# Patient Record
Sex: Male | Born: 1985
Health system: Southern US, Community
[De-identification: ages and names within clinical notes are randomized; demographics above are authoritative.]

---
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
## 2003-04-28 ENCOUNTER — Emergency Department (HOSPITAL_COMMUNITY): Admission: EM | Admit: 2003-04-28 | Discharge: 2003-04-28 | Payer: Self-pay | Admitting: Emergency Medicine

## 2003-06-03 ENCOUNTER — Emergency Department (HOSPITAL_COMMUNITY): Admission: AC | Admit: 2003-06-03 | Discharge: 2003-06-03 | Payer: Self-pay

## 2004-08-10 ENCOUNTER — Emergency Department (HOSPITAL_COMMUNITY): Admission: EM | Admit: 2004-08-10 | Discharge: 2004-08-10 | Payer: Self-pay | Admitting: Emergency Medicine

## 2004-09-02 ENCOUNTER — Emergency Department (HOSPITAL_COMMUNITY): Admission: EM | Admit: 2004-09-02 | Discharge: 2004-09-02 | Payer: Self-pay | Admitting: Emergency Medicine

## 2006-06-09 ENCOUNTER — Emergency Department (HOSPITAL_COMMUNITY): Admission: EM | Admit: 2006-06-09 | Discharge: 2006-06-10 | Payer: Self-pay | Admitting: Emergency Medicine

## 2010-06-18 NOTE — Op Note (Signed)
[place]. [facility]  Patient:    RYDAN, GULYAS                         MRN: [id number] Proc. Date: 05/16/99 Attending:  Elana Alm. Thurston Hole, M.D.                           Operative Report  PREOPERATIVE DIAGNOSIS:  Left ankle Salter III distal tibia fracture.  POSTOPERATIVE DIAGNOSIS:  Left ankle Salter III distal tibia fracture.  OPERATION:  Open reduction, internal fixation of left ankle distal tibial fracture.  SURGEON:  Elana Alm. Thurston Hole, M.D.  ASSISTANT:  Kirstin Adelberger, P.A.  ANESTHESIA:  General.  OPERATIVE TIME:  One hour and 40 minutes.  COMPLICATIONS:  None.  INDICATIONS FOR PROCEDURE:  Neely is a 25 year old who sustained a left ankle displaced Salter III fracture last night while playing basketball.  Because of his significant displacement, he is now to undergo ORIF of this.  DESCRIPTION OF PROCEDURE:  Diago was brought to the operating room on May 16, 1999, placed on the operative table in the supine position.  After an adequate level of general anesthesia was obtained, his left foot and leg using sterile Betadine and draped using sterile technique.  The leg was exsanguinated and calf _ elevated at 300 mm.  Initially, through a 5 cm anterior incision based over the  fracture fragment, initial exposure was made.  The underlying subcutaneous tissues were incised along with skin incision.  The neurovascular bundle was carefully protected while the underlying joint capsule was exposed and incised revealing he displaced fracture fragment.  This involved the anterior one-third of the tibia  including the growth plate into the joint.  There was no significant articular cartilage damage except for the obvious fracture line.  The hematoma was removed from around the fracture site.  The fracture was held in a reduced position while two separate K-wires from the cannulated 4.0 mm screw set were placed from anterior to posterior  allow holding the fracture in an anatomically reduced position along with the articular surface.  Each of these were measured.  A 46 and 44 mm length was found to be the appropriate size.  They were overdrilled with a 2.7 mm drill and then cannulated.  The 4.0 mm screws were placed over each one of these guidewires, screwed down into position, holding the fracture in an anatomic and  rigidly fixed position.  The mortis remained anatomic as well.  After this was done, the ankle could be brought to a full range of motion with no movement at he fracture site.  At this point, the wound was irrigated.  The joint capsule closed with 2-0 Vicryl.  The subcutaneous tissues closed with 2-0 Vicryl.  The skin closed with staples.  Doppler evaluation for pulses showed good strong Doppler pulses n the anterior tib, dorsalis pedis and posterior tib.  Sterile dressings were applied.  The leg was placed in a short leg splint in neutral position.  The patient was then awakened and taken to the recovery room in stable condition.  Needle and sponge count was correct x 2 at the end of the case. DD:  05/16/99 TD:  05/17/99 Job: [id number]

## 2017-12-22 DIAGNOSIS — J410 Simple chronic bronchitis: Secondary | ICD-10-CM | POA: Diagnosis not present

## 2017-12-22 DIAGNOSIS — R05 Cough: Secondary | ICD-10-CM | POA: Diagnosis not present

## 2017-12-22 DIAGNOSIS — R0683 Snoring: Secondary | ICD-10-CM | POA: Diagnosis not present

## 2017-12-22 DIAGNOSIS — J4540 Moderate persistent asthma, uncomplicated: Secondary | ICD-10-CM | POA: Diagnosis not present

## 2017-12-25 DIAGNOSIS — R05 Cough: Secondary | ICD-10-CM | POA: Diagnosis not present

## 2018-02-02 DIAGNOSIS — I739 Peripheral vascular disease, unspecified: Secondary | ICD-10-CM | POA: Diagnosis not present

## 2018-02-02 DIAGNOSIS — R05 Cough: Secondary | ICD-10-CM | POA: Diagnosis not present

## 2018-02-02 DIAGNOSIS — R0600 Dyspnea, unspecified: Secondary | ICD-10-CM | POA: Diagnosis not present

## 2018-02-02 DIAGNOSIS — J209 Acute bronchitis, unspecified: Secondary | ICD-10-CM | POA: Diagnosis not present

## 2018-02-02 DIAGNOSIS — R0602 Shortness of breath: Secondary | ICD-10-CM | POA: Diagnosis not present

## 2018-02-02 DIAGNOSIS — Z87891 Personal history of nicotine dependence: Secondary | ICD-10-CM | POA: Diagnosis not present

## 2018-02-02 DIAGNOSIS — I10 Essential (primary) hypertension: Secondary | ICD-10-CM | POA: Diagnosis not present

## 2018-02-05 DIAGNOSIS — R05 Cough: Secondary | ICD-10-CM | POA: Insufficient documentation

## 2018-02-05 DIAGNOSIS — R1084 Generalized abdominal pain: Secondary | ICD-10-CM | POA: Diagnosis not present

## 2018-02-05 DIAGNOSIS — R52 Pain, unspecified: Secondary | ICD-10-CM | POA: Diagnosis not present

## 2018-02-05 DIAGNOSIS — R079 Chest pain, unspecified: Secondary | ICD-10-CM | POA: Diagnosis not present

## 2018-02-05 DIAGNOSIS — R0902 Hypoxemia: Secondary | ICD-10-CM | POA: Diagnosis not present

## 2018-02-05 DIAGNOSIS — R072 Precordial pain: Secondary | ICD-10-CM | POA: Insufficient documentation

## 2018-02-05 NOTE — ED Triage Notes (Signed)
Pt presents with LUQ abd pain and already diagnosed bronchitis which he is on steroids and prescriptions for.

## 2018-02-06 ENCOUNTER — Emergency Department (HOSPITAL_COMMUNITY)
Admission: EM | Admit: 2018-02-06 | Discharge: 2018-02-06 | Disposition: A | Discharge status: Home / Self Care | Payer: BLUE CROSS/BLUE SHIELD | Attending: Emergency Medicine | Admitting: Emergency Medicine

## 2018-02-06 ENCOUNTER — Encounter (HOSPITAL_COMMUNITY): Payer: Self-pay

## 2018-02-06 ENCOUNTER — Emergency Department (HOSPITAL_COMMUNITY): Payer: BLUE CROSS/BLUE SHIELD

## 2018-02-06 DIAGNOSIS — R05 Cough: Secondary | ICD-10-CM

## 2018-02-06 DIAGNOSIS — R072 Precordial pain: Secondary | ICD-10-CM

## 2018-02-06 DIAGNOSIS — R079 Chest pain, unspecified: Secondary | ICD-10-CM | POA: Diagnosis not present

## 2018-02-06 DIAGNOSIS — R059 Cough, unspecified: Secondary | ICD-10-CM

## 2018-02-06 MED ORDER — HYDROCODONE-ACETAMINOPHEN 5-325 MG PO TABS: 1.0000 | ORAL_TABLET | Freq: Four times a day (QID) | ORAL | 0 refills | Status: DC | PRN

## 2018-02-06 MED ORDER — ALBUTEROL SULFATE (2.5 MG/3ML) 0.083% IN NEBU
5.0000 mg | INHALATION_SOLUTION | Freq: Once | RESPIRATORY_TRACT | Status: AC
  Administered 2018-02-06: 5 mg via RESPIRATORY_TRACT
  Filled 2018-02-06: qty 6

## 2018-02-06 MED ORDER — IBUPROFEN 200 MG PO TABS
400.0000 mg | ORAL_TABLET | Freq: Once | ORAL | Status: AC
  Administered 2018-02-06: 400 mg via ORAL
  Filled 2018-02-06: qty 2

## 2018-02-06 NOTE — ED Notes (Signed)
IS education given  Pt verbalized understand  Pt demonstrated correct use of IS  Pt breathing tx given

## 2018-02-06 NOTE — ED Triage Notes (Signed)
Pt complains of a productive cough for two days and now he states that his ribs on the left side are hurting Pt states that he's been diagnosed with bronchitis

## 2018-02-06 NOTE — ED Provider Notes (Signed)
[facility]-EMERGENCY DEPT Provider Note   CSN: [id number] Arrival date & time: 02/05/18  2230     History   Chief Complaint Chief Complaint  Patient presents with  . Cough    HPI James Levine is a 33 y.o. male.  The history is provided by the patient.  Cough  Severity:  Moderate Onset quality:  Gradual Timing:  Intermittent Progression:  Improving Worsened by:  Nothing Associated symptoms: no fever    Patient presents for evaluation for cough and chest wall pain.  He reports he has been coughing for up to a month.  He was seen in an outside hospital last week, diagnosed with bronchitis and given azithromycin/prednisone/codeine.  He reports this is started to improve his cough.  However he has been having persistent left-sided chest wall pain with palpation and coughing.  No fevers or vomiting.  No hemoptysis. Quit smoking about 4 weeks ago PMH-none Soc hx - former smoker Home Medications    Prior to Admission medications   Medication Sig Start Date End Date Taking? Authorizing Provider  guaiFENesin-codeine (ROBITUSSIN AC) 100-10 MG/5ML syrup Take 5 mLs by mouth every 6 (six) hours as needed for cough.   Yes [provider]  predniSONE (DELTASONE) 50 MG tablet Take 50 mg by mouth daily with breakfast.   Yes [provider]  HYDROcodone-acetaminophen (NORCO/VICODIN) 5-325 MG tablet Take 1 tablet by mouth every 6 (six) hours as needed for severe pain. 02/06/18   Zadie Rhine, MD    Family History History reviewed. No pertinent family history.  Social History Social History   Tobacco Use  . Smoking status: Never Smoker  . Smokeless tobacco: Never Used  Substance Use Topics  . Alcohol use: Yes  . Drug use: Never     Allergies   Patient has no known allergies.   Review of Systems Review of Systems  Constitutional: Negative for fever.  Respiratory: Positive for cough.   All other systems reviewed and are  negative.    Physical Exam Updated Vital Signs BP (!) 146/99 (BP Location: Right Arm)   Pulse 85   Temp 98.5 F (36.9 C) (Oral)   Resp 16   Ht 1.753 m (5\' 9" )   Wt (!) 164.7 kg   SpO2 93%   BMI 53.64 kg/m   Physical Exam CONSTITUTIONAL: Well developed/well nourished HEAD: Normocephalic/atraumatic EYES: EOMI ENMT: Mucous membranes moist NECK: supple no meningeal signs SPINE/BACK:entire spine nontender CV: S1/S2 noted, no murmurs/rubs/gallops noted LUNGS: Lungs are clear to auscultation bilaterally, no apparent distress Chest-point tenderness to left ribs without crepitus or bruising ABDOMEN: soft, nontender, obese  NEURO: Pt is awake/alert/appropriate, moves all extremitiesx4.  No facial droop.   EXTREMITIES: pulses normal/equal, full ROM,no lower extremity edema SKIN: warm, color normal PSYCH: no abnormalities of mood noted, alert and oriented to situation   ED Treatments / Results  Labs (all labs ordered are listed, but only abnormal results are displayed) Labs Reviewed - No data to display  EKG None  Radiology Dg Chest 2 View  Result Date: 02/06/2018 CLINICAL DATA:  Cough and congestion with left anterior chest pain for 2 weeks. EXAM: CHEST - 2 VIEW COMPARISON:  12/25/2017 FINDINGS: Borderline cardiomegaly, stable in appearance. Nonaneurysmal thoracic aorta. No acute pulmonary consolidation, effusion or edema. No acute osseous abnormality. IMPRESSION: No active cardiopulmonary disease. Electronically Signed   By: Tollie Eth M.D.   On: 02/06/2018 01:57    Procedures Procedures (including critical care time)  Medications Ordered in ED  Medications  albuterol (PROVENTIL) (2.5 MG/3ML) 0.083% nebulizer solution 5 mg (5 mg Nebulization Given 02/06/18 0600)  ibuprofen (ADVIL,MOTRIN) tablet 400 mg (400 mg Oral Given 02/06/18 [id number])     Initial Impression / Assessment and Plan / ED Course  I have reviewed the triage vital signs and the nursing notes.  Pertinent    imaging results that were available during my care of the patient were reviewed by me and considered in my medical decision making (see chart for details).     Patient's main concern was his chest wall pain.  He admits that the cough has been improving since starting medicines last week.  However he is having persistent chest wall pain.  I reviewed the x-ray and there is no obvious rib fracture, but he has obvious exquisite tenderness likely due to persistent coughing for a month.  I offered him an incentive spirometer anti-inflammatory and pain medicines.  Patient agreeable with plan.  He will continue to not smoke.  We discussed return precautions  Final Clinical Impressions(s) / ED Diagnoses   Final diagnoses:  Cough  Precordial pain    ED Discharge Orders         Ordered    HYDROcodone-acetaminophen (NORCO/VICODIN) 5-325 MG tablet  Every 6 hours PRN     02/06/18 0554           Zadie RhineWickline, Arcenia Scarbro, MD 02/06/18 [id number]

## 2018-03-12 DIAGNOSIS — Z0000 Encounter for general adult medical examination without abnormal findings: Secondary | ICD-10-CM | POA: Diagnosis not present

## 2018-03-12 DIAGNOSIS — Z13220 Encounter for screening for lipoid disorders: Secondary | ICD-10-CM | POA: Diagnosis not present

## 2018-03-14 DIAGNOSIS — R0681 Apnea, not elsewhere classified: Secondary | ICD-10-CM | POA: Diagnosis not present

## 2018-03-14 DIAGNOSIS — Z6841 Body Mass Index (BMI) 40.0 and over, adult: Secondary | ICD-10-CM | POA: Diagnosis not present

## 2018-04-07 DIAGNOSIS — R52 Pain, unspecified: Secondary | ICD-10-CM | POA: Diagnosis not present

## 2018-04-07 DIAGNOSIS — J111 Influenza due to unidentified influenza virus with other respiratory manifestations: Secondary | ICD-10-CM | POA: Diagnosis not present

## 2018-04-07 DIAGNOSIS — J101 Influenza due to other identified influenza virus with other respiratory manifestations: Secondary | ICD-10-CM | POA: Diagnosis not present

## 2018-05-07 ENCOUNTER — Observation Stay (HOSPITAL_COMMUNITY)
Admission: EM | Admit: 2018-05-07 | Discharge: 2018-05-09 | Disposition: A | Discharge status: Home / Self Care | Payer: BLUE CROSS/BLUE SHIELD | Attending: Internal Medicine | Admitting: Internal Medicine

## 2018-05-07 ENCOUNTER — Emergency Department (HOSPITAL_COMMUNITY): Payer: BLUE CROSS/BLUE SHIELD

## 2018-05-07 ENCOUNTER — Encounter (HOSPITAL_COMMUNITY): Payer: Self-pay | Admitting: Emergency Medicine

## 2018-05-07 ENCOUNTER — Other Ambulatory Visit: Payer: Self-pay

## 2018-05-07 ENCOUNTER — Ambulatory Visit (HOSPITAL_COMMUNITY)
Admission: EM | Admit: 2018-05-07 | Discharge: 2018-05-07 | Disposition: A | Discharge status: Home / Self Care | Payer: BLUE CROSS/BLUE SHIELD | Source: Home / Self Care | Attending: Family Medicine | Admitting: Family Medicine

## 2018-05-07 DIAGNOSIS — G4733 Obstructive sleep apnea (adult) (pediatric): Secondary | ICD-10-CM | POA: Diagnosis not present

## 2018-05-07 DIAGNOSIS — J9601 Acute respiratory failure with hypoxia: Secondary | ICD-10-CM | POA: Diagnosis not present

## 2018-05-07 DIAGNOSIS — S2242XA Multiple fractures of ribs, left side, initial encounter for closed fracture: Secondary | ICD-10-CM | POA: Diagnosis not present

## 2018-05-07 DIAGNOSIS — J90 Pleural effusion, not elsewhere classified: Secondary | ICD-10-CM | POA: Diagnosis not present

## 2018-05-07 DIAGNOSIS — R05 Cough: Secondary | ICD-10-CM | POA: Diagnosis not present

## 2018-05-07 DIAGNOSIS — Z79899 Other long term (current) drug therapy: Secondary | ICD-10-CM | POA: Insufficient documentation

## 2018-05-07 DIAGNOSIS — R0602 Shortness of breath: Secondary | ICD-10-CM | POA: Diagnosis not present

## 2018-05-07 DIAGNOSIS — G8929 Other chronic pain: Secondary | ICD-10-CM | POA: Insufficient documentation

## 2018-05-07 DIAGNOSIS — R079 Chest pain, unspecified: Secondary | ICD-10-CM | POA: Diagnosis not present

## 2018-05-07 DIAGNOSIS — X58XXXA Exposure to other specified factors, initial encounter: Secondary | ICD-10-CM | POA: Diagnosis not present

## 2018-05-07 DIAGNOSIS — I517 Cardiomegaly: Secondary | ICD-10-CM | POA: Diagnosis not present

## 2018-05-07 DIAGNOSIS — J9611 Chronic respiratory failure with hypoxia: Secondary | ICD-10-CM | POA: Diagnosis present

## 2018-05-07 DIAGNOSIS — S2232XA Fracture of one rib, left side, initial encounter for closed fracture: Secondary | ICD-10-CM | POA: Diagnosis present

## 2018-05-07 DIAGNOSIS — R109 Unspecified abdominal pain: Secondary | ICD-10-CM | POA: Diagnosis not present

## 2018-05-07 DIAGNOSIS — R0902 Hypoxemia: Secondary | ICD-10-CM

## 2018-05-07 DIAGNOSIS — Z7952 Long term (current) use of systemic steroids: Secondary | ICD-10-CM | POA: Insufficient documentation

## 2018-05-07 DIAGNOSIS — Z6841 Body Mass Index (BMI) 40.0 and over, adult: Secondary | ICD-10-CM | POA: Insufficient documentation

## 2018-05-07 HISTORY — DX: Morbid (severe) obesity due to excess calories: E66.01

## 2018-05-07 LAB — COMPREHENSIVE METABOLIC PANEL
ALT: 24 U/L (ref 0–44)
AST: 23 U/L (ref 15–41)
Albumin: 3.9 g/dL (ref 3.5–5.0)
Alkaline Phosphatase: 81 U/L (ref 38–126)
Anion gap: 6 (ref 5–15)
BUN: 10 mg/dL (ref 6–20)
CO2: 35 mmol/L — ABNORMAL HIGH (ref 22–32)
Calcium: 9.1 mg/dL (ref 8.9–10.3)
Chloride: 98 mmol/L (ref 98–111)
Creatinine, Ser: 1.03 mg/dL (ref 0.61–1.24)
GFR calc Af Amer: >60 mL/min (ref >60)
GFR calc non Af Amer: >60 mL/min (ref >60)
Glucose, Bld: 112 mg/dL — ABNORMAL HIGH (ref 70–99)
Potassium: 4.2 mmol/L (ref 3.5–5.1)
Sodium: 139 mmol/L (ref 135–145)
Total Bilirubin: 0.7 mg/dL (ref 0.3–1.2)
Total Protein: 7.4 g/dL (ref 6.5–8.1)

## 2018-05-07 LAB — CBC WITH DIFFERENTIAL/PLATELET
Abs Immature Granulocytes: 0.05 10*3/uL (ref 0.00–0.07)
Basophils Absolute: 0 10*3/uL (ref 0.0–0.1)
Basophils Relative: 1 %
Eosinophils Absolute: 0.1 10*3/uL (ref 0.0–0.5)
Eosinophils Relative: 1 %
HCT: 47.2 % (ref 39.0–52.0)
Hemoglobin: 13.4 g/dL (ref 13.0–17.0)
Immature Granulocytes: 1 %
Lymphocytes Relative: 20 %
Lymphs Abs: 1.6 10*3/uL (ref 0.7–4.0)
MCH: 26.2 pg (ref 26.0–34.0)
MCHC: 28.4 g/dL — ABNORMAL LOW (ref 30.0–36.0)
MCV: 92.4 fL (ref 80.0–100.0)
Monocytes Absolute: 0.6 10*3/uL (ref 0.1–1.0)
Monocytes Relative: 7 %
Neutro Abs: 5.8 10*3/uL (ref 1.7–7.7)
Neutrophils Relative %: 70 %
Platelets: 214 10*3/uL (ref 150–400)
RBC: 5.11 MIL/uL (ref 4.22–5.81)
RDW: 13.1 % (ref 11.5–15.5)
WBC: 8.1 10*3/uL (ref 4.0–10.5)
nRBC: 0 % (ref 0.0–0.2)

## 2018-05-07 LAB — TROPONIN I
Troponin I: <0.03 ng/mL (ref <0.03)
Troponin I: <0.03 ng/mL (ref <0.03)

## 2018-05-07 LAB — BRAIN NATRIURETIC PEPTIDE: B Natriuretic Peptide: 56.1 pg/mL (ref 0.0–100.0)

## 2018-05-07 LAB — LIPASE, BLOOD: Lipase: 26 U/L (ref 11–51)

## 2018-05-07 MED ORDER — OXYCODONE-ACETAMINOPHEN 5-325 MG PO TABS
1.0000 | ORAL_TABLET | ORAL | Status: DC | PRN
  Administered 2018-05-08 (×2): 1 via ORAL
  Filled 2018-05-07 (×2): qty 1

## 2018-05-07 MED ORDER — IOHEXOL 350 MG/ML SOLN
80.0000 mL | Freq: Once | INTRAVENOUS | Status: AC | PRN
  Administered 2018-05-07: 80 mL via INTRAVENOUS

## 2018-05-07 MED ORDER — MORPHINE SULFATE (PF) 2 MG/ML IV SOLN
1.0000 mg | INTRAVENOUS | Status: DC | PRN
  Administered 2018-05-08: 1 mg via INTRAVENOUS
  Filled 2018-05-07: qty 1

## 2018-05-07 NOTE — ED Notes (Signed)
Patient transported with RN to ED via wheelchair for further evaluation of decreased oxygen saturation per PA recommendations. Patient verbalizes understanding.
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
## 2018-05-07 NOTE — ED Triage Notes (Signed)
Pt reports left sided flank pain x months worse today; pt sts painful upon inspiration; pt noted to have O2 sats of 87-90% on RA without hx of same; providers alerted and pt placed on 2L [place]
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
## 2018-05-07 NOTE — Discharge Instructions (Signed)
Given low oxygen level at 87-90% on room air, go to the emergency department for further evaluation needed.

## 2018-05-07 NOTE — ED Notes (Signed)
Patient transported to CT 

## 2018-05-07 NOTE — ED Provider Notes (Signed)
[facility]    CSN: [id number] Arrival date & time: 05/07/18  1749     History   Chief Complaint Chief Complaint  Patient presents with  . Flank Pain    HPI James Levine is a 33 y.o. male.   33 year old male comes in for left sided flank/rib pain for the past few months.  States symptoms first started back in October 2019, where he had cough, shortness of breath, fever, and was diagnosed with bronchitis.  He was given Z-Pak and inhaler.  States shortness of breath and fever improved, but cough remained.  He then had another episode of similar symptoms in December 2019, also with fever,and was given Z-Pak, codeine cough syrup with some improvement of symptoms.  He was evaluated at the emergency department January 2020, with similar symptoms, as well as left-sided chest wall/flank pain.  Has had multiple chest x-rays, and states it has been negative.  He had a 2-hour car ride in January, otherwise no immobilization.  He denies leg swelling, hormone use. Former smoker.  He does state that he has some orthopnea since November, now using 2 pillows to sleep at night.  He has continued to cough, without worsening.  Denies shortness of breath without laying down.  Denies fever, chills, night sweats.  States he came in today because the pain is worse.  He denies personal or family history of blood clot.  Denies personal history of heart disease or lung disease.  Family history of pulmonary hypertension, CHF.     History reviewed. No pertinent past medical history.  There are no active problems to display for this patient.   History reviewed. No pertinent surgical history.     Home Medications    Prior to Admission medications   Medication Sig Start Date End Date Taking? Authorizing Provider  guaiFENesin-codeine (ROBITUSSIN AC) 100-10 MG/5ML syrup Take 5 mLs by mouth every 6 (six) hours as needed for cough.    [provider]  HYDROcodone-acetaminophen (NORCO/VICODIN)  5-325 MG tablet Take 1 tablet by mouth every 6 (six) hours as needed for severe pain. Patient not taking: Reported on 05/07/2018 02/06/18   Zadie RhineWickline, Donald, MD  predniSONE (DELTASONE) 50 MG tablet Take 50 mg by mouth daily with breakfast.    [provider]    Family History History reviewed. No pertinent family history.  Social History Social History   Tobacco Use  . Smoking status: Never Smoker  . Smokeless tobacco: Never Used  Substance Use Topics  . Alcohol use: Yes  . Drug use: Never     Allergies   Patient has no known allergies.   Review of Systems Review of Systems  Reason unable to perform ROS: See HPI as above.     Physical Exam Triage Vital Signs ED Triage Vitals  Enc Vitals Group     BP      Pulse      Resp      Temp      Temp src      SpO2      Weight      Height      Head Circumference      Peak Flow      Pain Score      Pain Loc      Pain Edu?      Excl. in GC?    No data found.  Updated Vital Signs BP (!) 167/102 (BP Location: Right Arm)   Pulse 99   Temp 98.3  F (36.8 C) (Oral)   Resp 18   SpO2 90%   Physical Exam Constitutional:      General: He is not in acute distress.    Appearance: He is well-developed. He is not ill-appearing, toxic-appearing or diaphoretic.  HENT:     Head: Normocephalic and atraumatic.  Eyes:     Conjunctiva/sclera: Conjunctivae normal.     Pupils: Pupils are equal, round, and reactive to light.  Neck:     Musculoskeletal: Normal range of motion and neck supple.  Cardiovascular:     Rate and Rhythm: Normal rate and regular rhythm.     Heart sounds: No murmur. No friction rub. No gallop.   Pulmonary:     Comments: Patient speaking in full sentences without difficulty. No respiratory distress. Difficult lung exam due to body habitus. Lungs clear to auscultation without obvious adventitious lung sounds.  Chest:     Comments: Tenderness to palpation of low lateral left rib.  Neurological:      Mental Status: He is alert and oriented to person, place, and time.    UC Treatments / Results  Labs (all labs ordered are listed, but only abnormal results are displayed) Labs Reviewed - No data to display  EKG None  Radiology No results found.  Procedures Procedures (including critical care time)  Medications Ordered in UC Medications - No data to display  Initial Impression / Assessment and Plan / UC Course  I have reviewed the triage vital signs and the nursing notes.  Pertinent labs & imaging results that were available during my care of the patient were reviewed by me and considered in my medical decision making (see chart for details).    33 year old male who presents with 22-month history of cough, few month history of left-sided chest wall/rib pain.  He has had multiple episodes of URI symptoms, some with fever, and has been given 2 rounds of antibiotics.  Cough has persisted, with intermittent shortness of breath.  73-month history of orthopnea.  He presents with new hypoxia, 87-90%.  He has been able to speak in full sentences without difficulty.  Lungs clear to auscultation bilaterally without obvious adventitious lung sounds, though difficult exam due to body habitus.  Given new onset hypoxia, ?PE/CHF, will discharge to emergency department for further evaluation needed.  Final Clinical Impressions(s) / UC Diagnoses   Final diagnoses:  Hypoxia   ED Prescriptions    None        Belinda Fisher, PA-C 05/07/18 1846

## 2018-05-07 NOTE — ED Triage Notes (Signed)
Pt here from UC with c/o left sided flank pain and sob. Pt states UC was concerned for a blood clot. Pt SP02 on arrival 86% on RA.  Pt placed on 2L [place].  No acute distress.

## 2018-05-08 ENCOUNTER — Encounter (HOSPITAL_COMMUNITY): Payer: Self-pay | Admitting: Internal Medicine

## 2018-05-08 DIAGNOSIS — S2242XA Multiple fractures of ribs, left side, initial encounter for closed fracture: Secondary | ICD-10-CM | POA: Diagnosis not present

## 2018-05-08 DIAGNOSIS — G4733 Obstructive sleep apnea (adult) (pediatric): Secondary | ICD-10-CM | POA: Diagnosis not present

## 2018-05-08 DIAGNOSIS — J9601 Acute respiratory failure with hypoxia: Secondary | ICD-10-CM | POA: Diagnosis not present

## 2018-05-08 DIAGNOSIS — I517 Cardiomegaly: Secondary | ICD-10-CM

## 2018-05-08 LAB — CBC
HCT: 48.2 % (ref 39.0–52.0)
Hemoglobin: 13.9 g/dL (ref 13.0–17.0)
MCH: 27.1 pg (ref 26.0–34.0)
MCHC: 28.8 g/dL — ABNORMAL LOW (ref 30.0–36.0)
MCV: 94 fL (ref 80.0–100.0)
Platelets: 205 10*3/uL (ref 150–400)
RBC: 5.13 MIL/uL (ref 4.22–5.81)
RDW: 13.2 % (ref 11.5–15.5)
WBC: 8.3 10*3/uL (ref 4.0–10.5)
nRBC: 0 % (ref 0.0–0.2)

## 2018-05-08 LAB — BASIC METABOLIC PANEL
Anion gap: 6 (ref 5–15)
BUN: 11 mg/dL (ref 6–20)
CO2: 35 mmol/L — ABNORMAL HIGH (ref 22–32)
Calcium: 8.9 mg/dL (ref 8.9–10.3)
Chloride: 98 mmol/L (ref 98–111)
Creatinine, Ser: 1.09 mg/dL (ref 0.61–1.24)
GFR calc Af Amer: >60 mL/min (ref >60)
GFR calc non Af Amer: >60 mL/min (ref >60)
Glucose, Bld: 109 mg/dL — ABNORMAL HIGH (ref 70–99)
Potassium: 5.1 mmol/L (ref 3.5–5.1)
Sodium: 139 mmol/L (ref 135–145)

## 2018-05-08 LAB — HIV ANTIBODY (ROUTINE TESTING W REFLEX): HIV Screen 4th Generation wRfx: NONREACTIVE

## 2018-05-08 MED ORDER — DM-GUAIFENESIN ER 30-600 MG PO TB12: 1.0000 | ORAL_TABLET | Freq: Two times a day (BID) | ORAL | Status: DC | PRN

## 2018-05-08 MED ORDER — ACETAMINOPHEN 650 MG RE SUPP: 650.0000 mg | Freq: Four times a day (QID) | RECTAL | Status: DC | PRN

## 2018-05-08 MED ORDER — PANTOPRAZOLE SODIUM 40 MG PO TBEC
40.0000 mg | DELAYED_RELEASE_TABLET | Freq: Every day | ORAL | Status: DC
  Administered 2018-05-08 – 2018-05-09 (×2): 40 mg via ORAL
  Filled 2018-05-08 (×2): qty 1

## 2018-05-08 MED ORDER — ENOXAPARIN SODIUM 80 MG/0.8ML ~~LOC~~ SOLN
80.0000 mg | SUBCUTANEOUS | Status: DC
  Filled 2018-05-08: qty 0.8

## 2018-05-08 MED ORDER — KETOROLAC TROMETHAMINE 15 MG/ML IJ SOLN
7.5000 mg | Freq: Three times a day (TID) | INTRAMUSCULAR | Status: DC
  Administered 2018-05-08 – 2018-05-09 (×2): 7.5 mg via INTRAVENOUS
  Filled 2018-05-08 (×2): qty 1

## 2018-05-08 MED ORDER — ACETAMINOPHEN 325 MG PO TABS
650.0000 mg | ORAL_TABLET | Freq: Four times a day (QID) | ORAL | Status: DC | PRN
  Administered 2018-05-09: 650 mg via ORAL
  Filled 2018-05-08: qty 2

## 2018-05-08 MED ORDER — ONDANSETRON HCL 4 MG/2ML IJ SOLN: 4.0000 mg | Freq: Four times a day (QID) | INTRAMUSCULAR | Status: DC | PRN

## 2018-05-08 MED ORDER — ONDANSETRON HCL 4 MG PO TABS: 4.0000 mg | ORAL_TABLET | Freq: Four times a day (QID) | ORAL | Status: DC | PRN

## 2018-05-08 MED ORDER — ALBUTEROL SULFATE (2.5 MG/3ML) 0.083% IN NEBU: 3.0000 mL | INHALATION_SOLUTION | RESPIRATORY_TRACT | Status: DC | PRN

## 2018-05-08 NOTE — Plan of Care (Signed)
  Problem: Education: Goal: Knowledge of General Education information will improve Description: Including pain rating scale, medication(s)/side effects and non-pharmacologic comfort measures Outcome: Progressing   Problem: Clinical Measurements: Goal: Ability to maintain clinical measurements within normal limits will improve Outcome: Progressing   Problem: Coping: Goal: Level of anxiety will decrease Outcome: Progressing   Problem: Pain Managment: Goal: General experience of comfort will improve Outcome: Progressing   Problem: Safety: Goal: Ability to remain free from injury will improve Outcome: Progressing   Problem: Skin Integrity: Goal: Risk for impaired skin integrity will decrease Outcome: Progressing   
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
## 2018-05-08 NOTE — Progress Notes (Signed)
Pt ambulated in the hallway. Patient denied shortness of breath or pain while ambulating.  Oxygen saturation dropped to 84, put patient in 3 L NS oxygen saturation up to 94.   Patient is currently lying in bed on 2 Liters nasal canula with oxygen saturation on 96%.  Patient refused bed alarm on, education given, pt verbalizes understanding and will call if need assistance.

## 2018-05-08 NOTE — Progress Notes (Signed)
SATURATION QUALIFICATIONS: (This note is used to comply with regulatory documentation for home oxygen)  Patient Saturations on Room Air at Rest = 90%  Patient Saturations on Room Air while Ambulating = 84%  Patient Saturations on 3 Liters of oxygen while Ambulating = 94%

## 2018-05-08 NOTE — Progress Notes (Signed)
   05/08/18 1700  Incentive Spirometry  Incentive Spirometry Goal (mL) (RN or RT) 1000 mL  Incentive Spirometry - Achieved (mL) (RN, NT, or RT) 750 mL  Incentive Spirometry - # of Times (RN or NT) 5  Incentive Spirometry Effort (RN) Needs reinforcement

## 2018-05-08 NOTE — Progress Notes (Signed)
I responded to a Hall to provide spiritual support for the patient. I visited the patient's room and shared words of encouragement. I shared that the Chaplain is available to provide additional support as needed or requested.    05/08/18 1600  Clinical Encounter Type  Visited With Patient  Visit Type Spiritual support  Referral From Nurse  Consult/Referral To Chaplain  Spiritual Encounters  Spiritual Needs Prayer    Chaplain Dr Melvyn Novas

## 2018-05-08 NOTE — ED Notes (Addendum)
ED TO INPATIENT HANDOFF REPORT  ED Nurse Name and Phone #:  Florentina Addison [phone]  S Name/Age/Gender James Levine 33 y.o. male Room/Bed: 024C/024C  Code Status   Code Status: Full Code  Home/SNF/Other Home Patient oriented to: self, place, time and situation Is this baseline? Yes   Triage Complete: Triage complete  Chief Complaint Flank Pain   Triage Note Pt here from UC with c/o left sided flank pain and sob. Pt states UC was concerned for a blood clot. Pt SP02 on arrival 86% on RA.  Pt placed on 2L [place].  No acute distress.     Allergies No Known Allergies  Level of Care/Admitting Diagnosis ED Disposition    ED Disposition Condition Comment   Admit  Hospital Area: [facility] [[id number]]  Level of Care: Telemetry Medical [104]  I expect the patient will be discharged within 24 hours: No (not a candidate for 5C-Observation unit)  Diagnosis: Acute respiratory failure with hypoxia [facility]) [454098]  Admitting Physician: Lorretta Harp [[id number]]  Attending Physician: Lorretta Harp [[id number]]  PT Class (Do Not Modify): Observation [104]  PT Acc Code (Do Not Modify): Observation [10022]       B Medical/Surgery History Past Medical History:  Diagnosis Date  . Morbid obesity (HCC)    History reviewed. No pertinent surgical history.   A IV Location/Drains/Wounds Patient Lines/Drains/Airways Status   Active Line/Drains/Airways    Name:   Placement date:   Placement time:   Site:   Days:   Peripheral IV 05/07/18 Left Antecubital   05/07/18    1902    Antecubital   1          Intake/Output Last 24 hours No intake or output data in the 24 hours ending 05/08/18 0200  Labs/Imaging Results for orders placed or performed during the hospital encounter of 05/07/18 (from the past 48 hour(s))  CBC with Differential     Status: Abnormal   Collection Time: 05/07/18  7:30 PM  Result Value Ref Range   WBC 8.1 4.0 - 10.5 K/uL   RBC 5.11 4.22 - 5.81 MIL/uL   Hemoglobin 13.4 13.0 -  17.0 g/dL   HCT 11.9 14.7 - 82.9 %   MCV 92.4 80.0 - 100.0 fL   MCH 26.2 26.0 - 34.0 pg   MCHC 28.4 (L) 30.0 - 36.0 g/dL   RDW 56.2 13.0 - 86.5 %   Platelets 214 150 - 400 K/uL   nRBC 0.0 0.0 - 0.2 %   Neutrophils Relative % 70 %   Neutro Abs 5.8 1.7 - 7.7 K/uL   Lymphocytes Relative 20 %   Lymphs Abs 1.6 0.7 - 4.0 K/uL   Monocytes Relative 7 %   Monocytes Absolute 0.6 0.1 - 1.0 K/uL   Eosinophils Relative 1 %   Eosinophils Absolute 0.1 0.0 - 0.5 K/uL   Basophils Relative 1 %   Basophils Absolute 0.0 0.0 - 0.1 K/uL   Immature Granulocytes 1 %   Abs Immature Granulocytes 0.05 0.00 - 0.07 K/uL    Comment: Performed at [facility], [address], [place], [place] [address]  Comprehensive metabolic panel     Status: Abnormal   Collection Time: 05/07/18  7:30 PM  Result Value Ref Range   Sodium 139 135 - 145 mmol/L   Potassium 4.2 3.5 - 5.1 mmol/L   Chloride 98 98 - 111 mmol/L   CO2 35 (H) 22 - 32 mmol/L   Glucose, Bld 112 (H) 70 -  99 mg/dL   BUN 10 6 - 20 mg/dL   Creatinine, Ser 4.091.03 0.61 - 1.24 mg/dL   Calcium 9.1 8.9 - 81.110.3 mg/dL   Total Protein 7.4 6.5 - 8.1 g/dL   Albumin 3.9 3.5 - 5.0 g/dL   AST 23 15 - 41 U/L   ALT 24 0 - 44 U/L   Alkaline Phosphatase 81 38 - 126 U/L   Total Bilirubin 0.7 0.3 - 1.2 mg/dL   GFR calc non Af Amer >60 >60 mL/min   GFR calc Af Amer >60 >60 mL/min   Anion gap 6 5 - 15    Comment: Performed at [facility], [address] [place], [place] [address]  Brain natriuretic peptide     Status: None   Collection Time: 05/07/18  7:30 PM  Result Value Ref Range   B Natriuretic Peptide 56.1 0.0 - 100.0 pg/mL    Comment: Performed at [facility], [address] [place], [place] [address]  Lipase, blood     Status: None   Collection Time: 05/07/18  7:30 PM  Result Value Ref Range   Lipase 26 11 - 51 U/L    Comment: Performed at [facility], [address] [place], [place] [address]  Troponin I - Now Then Q3H      Status: None   Collection Time: 05/07/18  7:30 PM  Result Value Ref Range   Troponin I <0.03 <0.03 ng/mL    Comment: Performed at [facility], [address] [place], [place] [address]  Troponin I - Now Then Q3H     Status: None   Collection Time: 05/07/18 10:26 PM  Result Value Ref Range   Troponin I <0.03 <0.03 ng/mL    Comment: Performed at [facility], [address] [place], [place] [address]   Ct Angio Chest Pe W And/or Wo Contrast  Result Date: 05/07/2018 CLINICAL DATA:  PE suspected, high pretest prob. Shortness of breath. Decreased oxygen saturation. EXAM: CT ANGIOGRAPHY CHEST WITH CONTRAST TECHNIQUE: Multidetector CT imaging of the chest was performed using the standard protocol during bolus administration of intravenous contrast. Multiplanar CT image reconstructions and MIPs were obtained to evaluate the vascular anatomy. CONTRAST:  80mL OMNIPAQUE IOHEXOL 350 MG/ML SOLN COMPARISON:  Chest radiograph earlier this day. FINDINGS: Cardiovascular: There are no filling defects within the central pulmonary arteries to suggest pulmonary embolus. Evaluation is diagnostic to the lobar level, the segmental and subsegmental branches are not well assessed due to soft tissue attenuation from habitus. Additionally, the lung bases are not well assessed due to breathing motion. Thoracic aorta is normal in caliber without dissection. Multi chamber cardiomegaly. No pericardial effusion. Mediastinum/Nodes: Small mediastinal and hilar lymph nodes are not enlarged by size criteria. No visualized thyroid nodule. Imaging obtained in expiration, trachea is otherwise grossly normal. Esophagus is decompressed. No mediastinal mass. Lungs/Pleura: Parenchymal evaluation limited by breathing motion and excretory phase imaging. Small left pleural effusion with minimal associated airspace opacity the inferior-most left lung base. No right pleural effusion. Scattered linear atelectasis. Suspect central  bronchial thickening. No pulmonary mass. Upper Abdomen: No acute findings. Musculoskeletal: Fractures of left lateral sixth, seventh, and eighth ribs have incomplete callus formation. No other acute osseous abnormality. Review of the MIP images confirms the above findings. IMPRESSION: 1. No central pulmonary embolus. Evaluation pulmonary arteries distal to the lobar level is limited given soft tissue attenuation from habitus. 2. Small left pleural effusion with minimal adjacent  airspace opacity. There are fractures of left lateral sixth through eighth ribs with incomplete callus formation suggesting these are subacute. In the setting of subacute rib fractures, left lung base findings may be due to resolving hemothorax and or contusion. 3. Cardiomegaly. Electronically Signed   By: Narda Rutherford M.D.   On: 05/07/2018 21:07   Dg Chest Portable 1 View  Result Date: 05/07/2018 CLINICAL DATA:  Cough and shortness of breath EXAM: PORTABLE CHEST 1 VIEW COMPARISON:  February 06, 2018 FINDINGS: Lungs are clear. Heart is mildly enlarged with pulmonary vascularity normal. No adenopathy. No bone lesions. IMPRESSION: Mild cardiac enlargement.  No edema or consolidation. Electronically Signed   By: Bretta Bang III M.D.   On: 05/07/2018 20:07    Pending Labs Unresulted Labs (From admission, onward)    Start     Ordered   05/08/18 0500  Basic metabolic panel  Tomorrow morning,   R     05/08/18 0106   05/08/18 0500  CBC  Tomorrow morning,   R     05/08/18 0106   05/08/18 0105  HIV antibody (Routine Testing)  Once,   R     05/08/18 0106          Vitals/Pain Today's Vitals   05/07/18 2230 05/07/18 2339 05/07/18 2356 05/08/18 0129  BP: (!) 119/59 127/73    Pulse: 90 90    Resp: 19 (!) 22    Temp:      TempSrc:      SpO2: 98% 90%    Weight:      Height:      PainSc:   9  4     Isolation Precautions No active isolations  Medications Medications  oxyCODONE-acetaminophen (PERCOCET/ROXICET) 5-325  MG per tablet 1 tablet (1 tablet Oral Given 05/08/18 0005)  morphine 2 MG/ML injection 1 mg (1 mg Intravenous Given 05/08/18 0156)  albuterol (PROVENTIL) (2.5 MG/3ML) 0.083% nebulizer solution 3 mL (has no administration in time range)  dextromethorphan-guaiFENesin (MUCINEX DM) 30-600 MG per 12 hr tablet 1 tablet (has no administration in time range)  enoxaparin (LOVENOX) injection 80 mg (has no administration in time range)  acetaminophen (TYLENOL) tablet 650 mg (has no administration in time range)    Or  acetaminophen (TYLENOL) suppository 650 mg (has no administration in time range)  ondansetron (ZOFRAN) tablet 4 mg (has no administration in time range)    Or  ondansetron (ZOFRAN) injection 4 mg (has no administration in time range)  iohexol (OMNIPAQUE) 350 MG/ML injection 80 mL (80 mLs Intravenous Contrast Given 05/07/18 2041)    Mobility walks Low fall risk   Focused Assessments Pulmonary Assessment Handoff:  Lung sounds:   O2 Device: Nasal Cannula O2 Flow Rate (L/min): 2 L/min      R Recommendations: See Admitting Provider Note  Report given to:   Additional Notes:   Patient on 2L via nasal cannula.

## 2018-05-08 NOTE — Progress Notes (Signed)
Patient arrived to [facility] from ED. Patient alert and oriented x4. Admission assessment complete. Patient resting in bed. No complaints at this time. Will continue to monitor patient.

## 2018-05-08 NOTE — Progress Notes (Signed)
RT placed pt on CPAP dream station for the night. Pt is a new start and has not had a sleep study done. Pt states MD wanted him to trial it while here in the hospital to see if he could tolerate. Pt was placed on auto titrate high 20 low 5 with 2 LPM bled into the system. Pt is utilizing a nasal mask which he says feels good to him. Pt tolerating well. RT will continue to monitor.

## 2018-05-09 ENCOUNTER — Encounter (HOSPITAL_COMMUNITY): Payer: Self-pay | Admitting: Internal Medicine

## 2018-05-09 DIAGNOSIS — J9601 Acute respiratory failure with hypoxia: Secondary | ICD-10-CM | POA: Diagnosis not present

## 2018-05-09 LAB — CBC WITH DIFFERENTIAL/PLATELET
Abs Immature Granulocytes: 0.04 10*3/uL (ref 0.00–0.07)
Basophils Absolute: 0 10*3/uL (ref 0.0–0.1)
Basophils Relative: 0 %
Eosinophils Absolute: 0.2 10*3/uL (ref 0.0–0.5)
Eosinophils Relative: 2 %
HCT: 46.5 % (ref 39.0–52.0)
Hemoglobin: 13.6 g/dL (ref 13.0–17.0)
Immature Granulocytes: 1 %
Lymphocytes Relative: 27 %
Lymphs Abs: 1.7 10*3/uL (ref 0.7–4.0)
MCH: 27 pg (ref 26.0–34.0)
MCHC: 29.2 g/dL — ABNORMAL LOW (ref 30.0–36.0)
MCV: 92.4 fL (ref 80.0–100.0)
Monocytes Absolute: 0.6 10*3/uL (ref 0.1–1.0)
Monocytes Relative: 10 %
Neutro Abs: 3.8 10*3/uL (ref 1.7–7.7)
Neutrophils Relative %: 60 %
Platelets: 213 10*3/uL (ref 150–400)
RBC: 5.03 MIL/uL (ref 4.22–5.81)
RDW: 13.1 % (ref 11.5–15.5)
WBC: 6.3 10*3/uL (ref 4.0–10.5)
nRBC: 0 % (ref 0.0–0.2)

## 2018-05-09 LAB — TSH: TSH: 3.217 u[IU]/mL (ref 0.350–4.500)

## 2018-05-09 LAB — BASIC METABOLIC PANEL
Anion gap: 7 (ref 5–15)
BUN: 12 mg/dL (ref 6–20)
CO2: 36 mmol/L — ABNORMAL HIGH (ref 22–32)
Calcium: 8.9 mg/dL (ref 8.9–10.3)
Chloride: 96 mmol/L — ABNORMAL LOW (ref 98–111)
Creatinine, Ser: 1.18 mg/dL (ref 0.61–1.24)
GFR calc Af Amer: >60 mL/min (ref >60)
GFR calc non Af Amer: >60 mL/min (ref >60)
Glucose, Bld: 105 mg/dL — ABNORMAL HIGH (ref 70–99)
Potassium: 4.4 mmol/L (ref 3.5–5.1)
Sodium: 139 mmol/L (ref 135–145)

## 2018-05-09 MED ORDER — PANTOPRAZOLE SODIUM 40 MG PO TBEC: 40.0000 mg | DELAYED_RELEASE_TABLET | Freq: Every day | ORAL | 0 refills | Status: DC

## 2018-05-09 MED ORDER — ACETAMINOPHEN 325 MG PO TABS: 650.0000 mg | ORAL_TABLET | Freq: Four times a day (QID) | ORAL | Status: AC | PRN
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
## 2018-05-09 NOTE — Progress Notes (Signed)
SATURATION QUALIFICATIONS: (This note is used to comply with regulatory documentation for home oxygen)  Patient Saturations on Room Air at Rest = 95%  Patient Saturations on Room Air while Ambulating = 91%  Patient Saturations on1 Liters of oxygen while Ambulating =95%  Please briefly explain why patient needs home oxygen:

## 2018-05-18 DIAGNOSIS — R0902 Hypoxemia: Secondary | ICD-10-CM | POA: Diagnosis not present

## 2018-05-18 DIAGNOSIS — S2242XA Multiple fractures of ribs, left side, initial encounter for closed fracture: Secondary | ICD-10-CM | POA: Diagnosis not present

## 2018-05-18 DIAGNOSIS — G4733 Obstructive sleep apnea (adult) (pediatric): Secondary | ICD-10-CM | POA: Diagnosis not present

## 2018-05-18 DIAGNOSIS — R05 Cough: Secondary | ICD-10-CM | POA: Diagnosis not present

## 2018-05-24 ENCOUNTER — Ambulatory Visit (INDEPENDENT_AMBULATORY_CARE_PROVIDER_SITE_OTHER): Payer: BLUE CROSS/BLUE SHIELD | Admitting: Internal Medicine

## 2018-05-24 ENCOUNTER — Inpatient Hospital Stay: Payer: BLUE CROSS/BLUE SHIELD | Admitting: Nurse Practitioner

## 2018-05-24 ENCOUNTER — Encounter: Payer: Self-pay | Admitting: Internal Medicine

## 2018-05-24 ENCOUNTER — Other Ambulatory Visit: Payer: Self-pay

## 2018-05-24 VITALS — BP 136/86 | HR 94 | Ht 68.0 in | Wt 368.0 lb

## 2018-05-24 DIAGNOSIS — R05 Cough: Secondary | ICD-10-CM | POA: Diagnosis not present

## 2018-05-24 DIAGNOSIS — G4733 Obstructive sleep apnea (adult) (pediatric): Secondary | ICD-10-CM | POA: Diagnosis not present

## 2018-05-24 DIAGNOSIS — R059 Cough, unspecified: Secondary | ICD-10-CM

## 2018-05-24 MED ORDER — FLUTICASONE-UMECLIDIN-VILANT 100-62.5-25 MCG/INH IN AEPB: 1.0000 | INHALATION_SPRAY | Freq: Every day | RESPIRATORY_TRACT | 0 refills | Status: DC

## 2018-05-24 MED ORDER — BENZONATATE 200 MG PO CAPS: 200.0000 mg | ORAL_CAPSULE | Freq: Three times a day (TID) | ORAL | 5 refills | Status: DC | PRN

## 2018-05-24 NOTE — Patient Instructions (Addendum)
Sample x 2 Trelegy inhaler    Inhale 1 puff, then rinse mouth, once daily  Script sent for benzonatate perles for cough- 1 every 8 hours if needed  Order- schedule HST   Dx OSA  Please call me about 2 weeks after your sleep study is done, for results and recommendations. If appropriate, we may be able to start treatment before we see you back.  Please call as needed
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
## 2018-05-28 DIAGNOSIS — R059 Cough, unspecified: Secondary | ICD-10-CM | POA: Insufficient documentation

## 2018-05-28 DIAGNOSIS — R05 Cough: Secondary | ICD-10-CM | POA: Insufficient documentation

## 2018-05-28 NOTE — Assessment & Plan Note (Signed)
Weight loss is likely contributor to sleep disordered breathing. Importance of weight loss emphasized.

## 2018-05-28 NOTE — Assessment & Plan Note (Signed)
Cough is worse supine and role of GERD is likely. Fluid shifts and post nasal drainage are possible Plan- elevate head, continue protonix, add benzonatate, sample Trelegy

## 2018-05-28 NOTE — Assessment & Plan Note (Signed)
Probable OSA, Discussed sleep hygiene, medical issues of OSA, importance of weight, driving safely, treatments. Plan- ordering home sleep test. Likely CPAP.

## 2018-06-12 ENCOUNTER — Telehealth: Payer: Self-pay | Admitting: Internal Medicine

## 2018-06-12 MED ORDER — FUROSEMIDE 20 MG PO TABS: 20.0000 mg | ORAL_TABLET | Freq: Every day | ORAL | 0 refills | Status: DC

## 2018-06-12 NOTE — Telephone Encounter (Signed)
Called and spoke with Patient.  Patient is seen by Dr Maple Hudson, last OV 05/24/18.  Patient stated he is having increased SHOB after walking 5-6 steps, this started 1 week ago and has not improved.  Patient stated he has notice some bilateral swelling below his knees, that also started 1 week ago. Patient stated swelling comes and goes. Patient is having a non productive cough, that he believes is related to pollen.  Patient is concerned with Covid 19 and his SHOB.  Patient denies fever, chills, or exposure to Covid 19.  Message routed to Dr Maple Hudson  No Known Allergies Current Outpatient Medications on File Prior to Visit  Medication Sig Dispense Refill  . acetaminophen (TYLENOL) 325 MG tablet Take 2 tablets (650 mg total) by mouth every 6 (six) hours as needed for mild pain (or Fever >/= 101).    . benzonatate (TESSALON) 200 MG capsule Take 1 capsule (200 mg total) by mouth 3 (three) times daily as needed for cough. 50 capsule 5  . Fluticasone-Umeclidin-Vilant (TRELEGY ELLIPTA) 100-62.5-25 MCG/INH AEPB Inhale 1 puff into the lungs daily. 28 each 0  . ibuprofen (ADVIL,MOTRIN) 200 MG tablet Take 400 mg by mouth every 6 (six) hours as needed (for pain).    . pantoprazole (PROTONIX) 40 MG tablet Take 1 tablet (40 mg total) by mouth daily for 30 days. 30 tablet 0   No current facility-administered medications on file prior to visit.

## 2018-06-12 NOTE — Telephone Encounter (Signed)
If there is no fever, no muscle aches, no exposure to sick people, then suggest we treat the fluid retention for a few days to see if that helps.  Offer lasix 20 mg, # 6, 1 each morning to reduce fluid

## 2018-06-12 NOTE — Telephone Encounter (Signed)
Called and spoke with pt's mother regarding CY recommendations Placed order to [facility] for Laxix 20mg  #6 for one tab daily to reduce fluid Pt's mother feels that pt could possible have COVID and would like him tested Advised that we do not test for COVID in our office; he would need to go to [facility] ED Pt advised that she might take pt to [facility] ED to be tested for COVID today Pt verbalized and expressed understanding Nothing further needed.

## 2018-06-17 DIAGNOSIS — G4733 Obstructive sleep apnea (adult) (pediatric): Secondary | ICD-10-CM | POA: Diagnosis not present

## 2018-06-17 DIAGNOSIS — R0902 Hypoxemia: Secondary | ICD-10-CM | POA: Diagnosis not present

## 2018-06-17 DIAGNOSIS — I110 Hypertensive heart disease with heart failure: Secondary | ICD-10-CM | POA: Diagnosis not present

## 2018-06-17 DIAGNOSIS — F1010 Alcohol abuse, uncomplicated: Secondary | ICD-10-CM | POA: Diagnosis not present

## 2018-06-17 DIAGNOSIS — R0609 Other forms of dyspnea: Secondary | ICD-10-CM | POA: Diagnosis not present

## 2018-06-17 DIAGNOSIS — J9601 Acute respiratory failure with hypoxia: Secondary | ICD-10-CM | POA: Diagnosis not present

## 2018-06-17 DIAGNOSIS — I5030 Unspecified diastolic (congestive) heart failure: Secondary | ICD-10-CM | POA: Diagnosis not present

## 2018-06-17 DIAGNOSIS — J9611 Chronic respiratory failure with hypoxia: Secondary | ICD-10-CM | POA: Diagnosis not present

## 2018-06-17 DIAGNOSIS — E662 Morbid (severe) obesity with alveolar hypoventilation: Secondary | ICD-10-CM | POA: Diagnosis not present

## 2018-06-17 DIAGNOSIS — J9602 Acute respiratory failure with hypercapnia: Secondary | ICD-10-CM | POA: Diagnosis not present

## 2018-06-17 DIAGNOSIS — Z8249 Family history of ischemic heart disease and other diseases of the circulatory system: Secondary | ICD-10-CM | POA: Diagnosis not present

## 2018-06-17 DIAGNOSIS — I2729 Other secondary pulmonary hypertension: Secondary | ICD-10-CM | POA: Diagnosis not present

## 2018-06-17 DIAGNOSIS — Z87891 Personal history of nicotine dependence: Secondary | ICD-10-CM | POA: Diagnosis not present

## 2018-06-17 DIAGNOSIS — Q211 Atrial septal defect: Secondary | ICD-10-CM | POA: Diagnosis not present

## 2018-06-17 DIAGNOSIS — J9621 Acute and chronic respiratory failure with hypoxia: Secondary | ICD-10-CM | POA: Diagnosis not present

## 2018-06-17 DIAGNOSIS — F1290 Cannabis use, unspecified, uncomplicated: Secondary | ICD-10-CM | POA: Diagnosis not present

## 2018-06-17 DIAGNOSIS — R931 Abnormal findings on diagnostic imaging of heart and coronary circulation: Secondary | ICD-10-CM | POA: Diagnosis not present

## 2018-06-17 DIAGNOSIS — I2722 Pulmonary hypertension due to left heart disease: Secondary | ICD-10-CM | POA: Diagnosis not present

## 2018-06-17 DIAGNOSIS — I700 Atherosclerosis of aorta: Secondary | ICD-10-CM | POA: Diagnosis not present

## 2018-06-17 DIAGNOSIS — I5082 Biventricular heart failure: Secondary | ICD-10-CM | POA: Diagnosis not present

## 2018-06-17 DIAGNOSIS — Z6841 Body Mass Index (BMI) 40.0 and over, adult: Secondary | ICD-10-CM | POA: Diagnosis not present

## 2018-06-17 DIAGNOSIS — R0602 Shortness of breath: Secondary | ICD-10-CM | POA: Diagnosis not present

## 2018-06-17 DIAGNOSIS — Z9981 Dependence on supplemental oxygen: Secondary | ICD-10-CM | POA: Diagnosis not present

## 2018-06-17 DIAGNOSIS — J45909 Unspecified asthma, uncomplicated: Secondary | ICD-10-CM | POA: Diagnosis not present

## 2018-06-17 DIAGNOSIS — R600 Localized edema: Secondary | ICD-10-CM | POA: Diagnosis not present

## 2018-06-17 DIAGNOSIS — J9622 Acute and chronic respiratory failure with hypercapnia: Secondary | ICD-10-CM | POA: Diagnosis not present

## 2018-06-17 DIAGNOSIS — J9612 Chronic respiratory failure with hypercapnia: Secondary | ICD-10-CM | POA: Diagnosis not present

## 2018-06-17 DIAGNOSIS — Z20828 Contact with and (suspected) exposure to other viral communicable diseases: Secondary | ICD-10-CM | POA: Diagnosis not present

## 2018-06-17 DIAGNOSIS — I5033 Acute on chronic diastolic (congestive) heart failure: Secondary | ICD-10-CM | POA: Diagnosis not present

## 2018-06-17 DIAGNOSIS — Z1159 Encounter for screening for other viral diseases: Secondary | ICD-10-CM | POA: Diagnosis not present

## 2018-06-17 DIAGNOSIS — I272 Pulmonary hypertension, unspecified: Secondary | ICD-10-CM | POA: Diagnosis not present

## 2018-06-17 DIAGNOSIS — I50810 Right heart failure, unspecified: Secondary | ICD-10-CM | POA: Diagnosis not present

## 2018-06-17 DIAGNOSIS — I2723 Pulmonary hypertension due to lung diseases and hypoxia: Secondary | ICD-10-CM | POA: Diagnosis not present

## 2018-06-18 DIAGNOSIS — J9601 Acute respiratory failure with hypoxia: Secondary | ICD-10-CM | POA: Diagnosis not present

## 2018-06-18 DIAGNOSIS — R0602 Shortness of breath: Secondary | ICD-10-CM | POA: Diagnosis not present

## 2018-06-18 DIAGNOSIS — Z6841 Body Mass Index (BMI) 40.0 and over, adult: Secondary | ICD-10-CM | POA: Diagnosis not present

## 2018-06-18 DIAGNOSIS — I2723 Pulmonary hypertension due to lung diseases and hypoxia: Secondary | ICD-10-CM | POA: Diagnosis not present

## 2018-06-18 DIAGNOSIS — E662 Morbid (severe) obesity with alveolar hypoventilation: Secondary | ICD-10-CM | POA: Diagnosis not present

## 2018-06-19 DIAGNOSIS — I272 Pulmonary hypertension, unspecified: Secondary | ICD-10-CM | POA: Diagnosis not present

## 2018-06-19 DIAGNOSIS — R0602 Shortness of breath: Secondary | ICD-10-CM | POA: Diagnosis not present

## 2018-06-19 DIAGNOSIS — I2722 Pulmonary hypertension due to left heart disease: Secondary | ICD-10-CM | POA: Diagnosis not present

## 2018-06-19 DIAGNOSIS — Z87891 Personal history of nicotine dependence: Secondary | ICD-10-CM | POA: Diagnosis not present

## 2018-06-19 DIAGNOSIS — Z6841 Body Mass Index (BMI) 40.0 and over, adult: Secondary | ICD-10-CM | POA: Diagnosis not present

## 2018-06-19 DIAGNOSIS — J9601 Acute respiratory failure with hypoxia: Secondary | ICD-10-CM | POA: Diagnosis not present

## 2018-06-19 DIAGNOSIS — E662 Morbid (severe) obesity with alveolar hypoventilation: Secondary | ICD-10-CM | POA: Diagnosis not present

## 2018-06-20 DIAGNOSIS — J9601 Acute respiratory failure with hypoxia: Secondary | ICD-10-CM | POA: Diagnosis not present

## 2018-06-20 DIAGNOSIS — E662 Morbid (severe) obesity with alveolar hypoventilation: Secondary | ICD-10-CM | POA: Diagnosis not present

## 2018-06-20 DIAGNOSIS — I2723 Pulmonary hypertension due to lung diseases and hypoxia: Secondary | ICD-10-CM | POA: Diagnosis not present

## 2018-06-20 DIAGNOSIS — I272 Pulmonary hypertension, unspecified: Secondary | ICD-10-CM | POA: Diagnosis not present

## 2018-06-20 DIAGNOSIS — G4733 Obstructive sleep apnea (adult) (pediatric): Secondary | ICD-10-CM | POA: Diagnosis not present

## 2018-06-20 DIAGNOSIS — I700 Atherosclerosis of aorta: Secondary | ICD-10-CM | POA: Diagnosis not present

## 2018-06-20 DIAGNOSIS — Q211 Atrial septal defect: Secondary | ICD-10-CM | POA: Diagnosis not present

## 2018-06-20 DIAGNOSIS — Z6841 Body Mass Index (BMI) 40.0 and over, adult: Secondary | ICD-10-CM | POA: Diagnosis not present

## 2018-06-21 DIAGNOSIS — J9602 Acute respiratory failure with hypercapnia: Secondary | ICD-10-CM | POA: Diagnosis not present

## 2018-06-21 DIAGNOSIS — J9601 Acute respiratory failure with hypoxia: Secondary | ICD-10-CM | POA: Diagnosis not present

## 2018-06-21 DIAGNOSIS — I272 Pulmonary hypertension, unspecified: Secondary | ICD-10-CM | POA: Diagnosis not present

## 2018-06-21 DIAGNOSIS — Q211 Atrial septal defect: Secondary | ICD-10-CM | POA: Diagnosis not present

## 2018-06-22 DIAGNOSIS — J9601 Acute respiratory failure with hypoxia: Secondary | ICD-10-CM | POA: Diagnosis not present

## 2018-06-22 DIAGNOSIS — I272 Pulmonary hypertension, unspecified: Secondary | ICD-10-CM | POA: Diagnosis not present

## 2018-06-22 DIAGNOSIS — G4733 Obstructive sleep apnea (adult) (pediatric): Secondary | ICD-10-CM | POA: Diagnosis not present

## 2018-06-22 DIAGNOSIS — R0609 Other forms of dyspnea: Secondary | ICD-10-CM | POA: Diagnosis not present

## 2018-06-22 DIAGNOSIS — Q211 Atrial septal defect: Secondary | ICD-10-CM | POA: Diagnosis not present

## 2018-06-22 DIAGNOSIS — R0902 Hypoxemia: Secondary | ICD-10-CM | POA: Diagnosis not present

## 2018-06-22 DIAGNOSIS — J9602 Acute respiratory failure with hypercapnia: Secondary | ICD-10-CM | POA: Diagnosis not present

## 2018-06-22 DIAGNOSIS — R0602 Shortness of breath: Secondary | ICD-10-CM | POA: Diagnosis not present

## 2018-06-23 DIAGNOSIS — I2722 Pulmonary hypertension due to left heart disease: Secondary | ICD-10-CM | POA: Diagnosis not present

## 2018-06-23 DIAGNOSIS — I5030 Unspecified diastolic (congestive) heart failure: Secondary | ICD-10-CM | POA: Diagnosis not present

## 2018-06-23 DIAGNOSIS — J9601 Acute respiratory failure with hypoxia: Secondary | ICD-10-CM | POA: Diagnosis not present

## 2018-06-23 DIAGNOSIS — Q211 Atrial septal defect: Secondary | ICD-10-CM | POA: Diagnosis not present

## 2018-06-24 DIAGNOSIS — I272 Pulmonary hypertension, unspecified: Secondary | ICD-10-CM | POA: Diagnosis not present

## 2018-06-24 DIAGNOSIS — J9602 Acute respiratory failure with hypercapnia: Secondary | ICD-10-CM | POA: Diagnosis not present

## 2018-06-24 DIAGNOSIS — J9601 Acute respiratory failure with hypoxia: Secondary | ICD-10-CM | POA: Diagnosis not present

## 2018-06-25 DIAGNOSIS — J9612 Chronic respiratory failure with hypercapnia: Secondary | ICD-10-CM | POA: Diagnosis not present

## 2018-06-25 DIAGNOSIS — J9602 Acute respiratory failure with hypercapnia: Secondary | ICD-10-CM | POA: Diagnosis not present

## 2018-06-25 DIAGNOSIS — I272 Pulmonary hypertension, unspecified: Secondary | ICD-10-CM | POA: Diagnosis not present

## 2018-06-25 DIAGNOSIS — I2722 Pulmonary hypertension due to left heart disease: Secondary | ICD-10-CM | POA: Diagnosis not present

## 2018-06-25 DIAGNOSIS — J9601 Acute respiratory failure with hypoxia: Secondary | ICD-10-CM | POA: Diagnosis not present

## 2018-06-25 DIAGNOSIS — I2723 Pulmonary hypertension due to lung diseases and hypoxia: Secondary | ICD-10-CM | POA: Diagnosis not present

## 2018-06-25 DIAGNOSIS — J9611 Chronic respiratory failure with hypoxia: Secondary | ICD-10-CM | POA: Diagnosis not present

## 2018-06-26 DIAGNOSIS — J9612 Chronic respiratory failure with hypercapnia: Secondary | ICD-10-CM | POA: Diagnosis not present

## 2018-06-26 DIAGNOSIS — I2722 Pulmonary hypertension due to left heart disease: Secondary | ICD-10-CM | POA: Diagnosis not present

## 2018-06-26 DIAGNOSIS — I50810 Right heart failure, unspecified: Secondary | ICD-10-CM | POA: Diagnosis not present

## 2018-06-26 DIAGNOSIS — I2723 Pulmonary hypertension due to lung diseases and hypoxia: Secondary | ICD-10-CM | POA: Diagnosis not present

## 2018-06-27 DIAGNOSIS — I50810 Right heart failure, unspecified: Secondary | ICD-10-CM | POA: Diagnosis not present

## 2018-06-27 DIAGNOSIS — I2722 Pulmonary hypertension due to left heart disease: Secondary | ICD-10-CM | POA: Diagnosis not present

## 2018-06-27 DIAGNOSIS — I2723 Pulmonary hypertension due to lung diseases and hypoxia: Secondary | ICD-10-CM | POA: Diagnosis not present

## 2018-06-27 DIAGNOSIS — I5033 Acute on chronic diastolic (congestive) heart failure: Secondary | ICD-10-CM | POA: Diagnosis not present

## 2018-06-27 DIAGNOSIS — J9612 Chronic respiratory failure with hypercapnia: Secondary | ICD-10-CM | POA: Diagnosis not present

## 2018-06-28 DIAGNOSIS — Z9981 Dependence on supplemental oxygen: Secondary | ICD-10-CM | POA: Diagnosis not present

## 2018-06-28 DIAGNOSIS — J9612 Chronic respiratory failure with hypercapnia: Secondary | ICD-10-CM | POA: Diagnosis not present

## 2018-06-28 DIAGNOSIS — Z6841 Body Mass Index (BMI) 40.0 and over, adult: Secondary | ICD-10-CM | POA: Diagnosis not present

## 2018-06-28 DIAGNOSIS — I50810 Right heart failure, unspecified: Secondary | ICD-10-CM | POA: Diagnosis not present

## 2018-06-28 DIAGNOSIS — R931 Abnormal findings on diagnostic imaging of heart and coronary circulation: Secondary | ICD-10-CM | POA: Diagnosis not present

## 2018-06-28 DIAGNOSIS — J9611 Chronic respiratory failure with hypoxia: Secondary | ICD-10-CM | POA: Diagnosis not present

## 2018-06-28 DIAGNOSIS — I5033 Acute on chronic diastolic (congestive) heart failure: Secondary | ICD-10-CM | POA: Diagnosis not present

## 2018-06-29 DIAGNOSIS — J9611 Chronic respiratory failure with hypoxia: Secondary | ICD-10-CM | POA: Diagnosis not present

## 2018-06-29 DIAGNOSIS — Z6841 Body Mass Index (BMI) 40.0 and over, adult: Secondary | ICD-10-CM | POA: Diagnosis not present

## 2018-06-29 DIAGNOSIS — J9612 Chronic respiratory failure with hypercapnia: Secondary | ICD-10-CM | POA: Diagnosis not present

## 2018-06-30 DIAGNOSIS — J9611 Chronic respiratory failure with hypoxia: Secondary | ICD-10-CM | POA: Diagnosis not present

## 2018-06-30 DIAGNOSIS — Z6841 Body Mass Index (BMI) 40.0 and over, adult: Secondary | ICD-10-CM | POA: Diagnosis not present

## 2018-06-30 DIAGNOSIS — J9612 Chronic respiratory failure with hypercapnia: Secondary | ICD-10-CM | POA: Diagnosis not present

## 2018-07-01 DIAGNOSIS — I272 Pulmonary hypertension, unspecified: Secondary | ICD-10-CM | POA: Diagnosis not present

## 2018-07-01 DIAGNOSIS — G4733 Obstructive sleep apnea (adult) (pediatric): Secondary | ICD-10-CM | POA: Diagnosis not present

## 2018-07-01 DIAGNOSIS — J9601 Acute respiratory failure with hypoxia: Secondary | ICD-10-CM | POA: Diagnosis not present

## 2018-07-01 DIAGNOSIS — R0602 Shortness of breath: Secondary | ICD-10-CM | POA: Diagnosis not present

## 2018-07-01 DIAGNOSIS — E662 Morbid (severe) obesity with alveolar hypoventilation: Secondary | ICD-10-CM | POA: Diagnosis not present

## 2018-07-09 ENCOUNTER — Ambulatory Visit: Payer: BLUE CROSS/BLUE SHIELD

## 2018-07-09 ENCOUNTER — Other Ambulatory Visit: Payer: Self-pay

## 2018-07-09 DIAGNOSIS — G4733 Obstructive sleep apnea (adult) (pediatric): Secondary | ICD-10-CM

## 2018-07-10 DIAGNOSIS — G4733 Obstructive sleep apnea (adult) (pediatric): Secondary | ICD-10-CM | POA: Diagnosis not present

## 2018-07-11 DIAGNOSIS — G4733 Obstructive sleep apnea (adult) (pediatric): Secondary | ICD-10-CM

## 2018-07-12 DIAGNOSIS — I5032 Chronic diastolic (congestive) heart failure: Secondary | ICD-10-CM | POA: Diagnosis not present

## 2018-07-12 DIAGNOSIS — Z09 Encounter for follow-up examination after completed treatment for conditions other than malignant neoplasm: Secondary | ICD-10-CM | POA: Diagnosis not present

## 2018-07-13 DIAGNOSIS — Z6841 Body Mass Index (BMI) 40.0 and over, adult: Secondary | ICD-10-CM | POA: Diagnosis not present

## 2018-07-13 DIAGNOSIS — Z9981 Dependence on supplemental oxygen: Secondary | ICD-10-CM | POA: Diagnosis not present

## 2018-07-13 DIAGNOSIS — I50810 Right heart failure, unspecified: Secondary | ICD-10-CM | POA: Diagnosis not present

## 2018-07-13 DIAGNOSIS — J9691 Respiratory failure, unspecified with hypoxia: Secondary | ICD-10-CM | POA: Diagnosis not present

## 2018-07-13 DIAGNOSIS — I5020 Unspecified systolic (congestive) heart failure: Secondary | ICD-10-CM | POA: Diagnosis not present

## 2018-07-13 DIAGNOSIS — G4730 Sleep apnea, unspecified: Secondary | ICD-10-CM | POA: Diagnosis not present

## 2018-07-13 DIAGNOSIS — I272 Pulmonary hypertension, unspecified: Secondary | ICD-10-CM | POA: Diagnosis not present

## 2018-07-24 ENCOUNTER — Telehealth: Payer: Self-pay | Admitting: Internal Medicine

## 2018-07-24 NOTE — Telephone Encounter (Signed)
Home sleep test shoed severe obstructive sleep apnea, averaging 63 apneas/ hour, with low blood oxygen level.  I recommend we order new DME, new CPAP auto 5-20, mask of choice, humidifier, supplies, Airview/ card  Please confirm that he a=has a f/u ov in 311-90 days per insurance regs.

## 2018-07-24 NOTE — Telephone Encounter (Signed)
He can keep his current BIPAP. I think we just needed to update documentation that he has diagnosis of OSA. We need to get a download from his DME so we know settings and compliance. Thanks.

## 2018-07-24 NOTE — Telephone Encounter (Signed)
Spoke with patient. He was requesting the results of his HST. Advised him that I ask CY for his results, he verbalized understanding.   CY, please advise on results. Results have been scanned into his chart. Thanks!

## 2018-07-24 NOTE — Telephone Encounter (Signed)
Called and spoke with pt letting him know the results of the HST. Pt verbalized understanding. While stating to pt that recommendation was to start pt on CPAP, pt stated to me that he was currently on BIPAP as he was started on BIPAP after hospital stay 5/14-5/31.  Pt stated that [facility] is DME who he has the BIPAP through.  Dr. Annamaria Boots, please advise if you still want to switch pt to CPAP or keep pt on BIPAP which he currently has?

## 2018-07-24 NOTE — Telephone Encounter (Signed)
Called [facility] and spoke with Lavella Lemons to see if we could get pt added to [place] so we can be able to see his BIPAP information.   Pt has been added to [facility] and download has been printed for CY to view.  Pt's current bipap settings are EPAP 10 IPAP 14 with 2L O2 bled into the BIPAP.  Routing back to Dr. Annamaria Boots.

## 2018-07-25 ENCOUNTER — Telehealth: Payer: Self-pay | Admitting: Internal Medicine

## 2018-07-25 NOTE — Telephone Encounter (Signed)
He is already on BIPAP   IPAP 14, EPAP 10, Easy-Breathe/ [facility]  Download for 5/24- 6/22/ 2020 40% compliance, AHI 8.2/ hr      Sent to scan

## 2018-07-26 NOTE — Telephone Encounter (Signed)
See phone note from 06/23. This has been addressed. Nothing further is needed at this time. Left message informing patient nothing further was needed and we would address further at [place].

## 2018-07-31 DIAGNOSIS — R0602 Shortness of breath: Secondary | ICD-10-CM | POA: Diagnosis not present

## 2018-07-31 DIAGNOSIS — I272 Pulmonary hypertension, unspecified: Secondary | ICD-10-CM | POA: Diagnosis not present

## 2018-08-08 DIAGNOSIS — I5032 Chronic diastolic (congestive) heart failure: Secondary | ICD-10-CM | POA: Diagnosis not present

## 2018-08-08 DIAGNOSIS — Z6841 Body Mass Index (BMI) 40.0 and over, adult: Secondary | ICD-10-CM | POA: Diagnosis not present

## 2018-08-08 DIAGNOSIS — Z9981 Dependence on supplemental oxygen: Secondary | ICD-10-CM | POA: Diagnosis not present

## 2018-08-10 DIAGNOSIS — I50810 Right heart failure, unspecified: Secondary | ICD-10-CM | POA: Diagnosis not present

## 2018-08-10 DIAGNOSIS — G4730 Sleep apnea, unspecified: Secondary | ICD-10-CM | POA: Diagnosis not present

## 2018-08-10 DIAGNOSIS — Z6841 Body Mass Index (BMI) 40.0 and over, adult: Secondary | ICD-10-CM | POA: Diagnosis not present

## 2018-08-10 DIAGNOSIS — E662 Morbid (severe) obesity with alveolar hypoventilation: Secondary | ICD-10-CM | POA: Diagnosis not present

## 2018-08-23 ENCOUNTER — Encounter: Payer: Self-pay | Admitting: Internal Medicine

## 2018-08-23 ENCOUNTER — Other Ambulatory Visit: Payer: Self-pay

## 2018-08-23 ENCOUNTER — Ambulatory Visit (INDEPENDENT_AMBULATORY_CARE_PROVIDER_SITE_OTHER): Payer: BC Managed Care – PPO | Admitting: Internal Medicine

## 2018-08-23 VITALS — BP 128/80 | HR 82 | Temp 98.6°F | Ht 69.0 in | Wt 341.8 lb

## 2018-08-23 DIAGNOSIS — G4733 Obstructive sleep apnea (adult) (pediatric): Secondary | ICD-10-CM | POA: Diagnosis not present
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
## 2018-08-23 NOTE — Patient Instructions (Signed)
Order- DME [facility]- continue BIPAP 14/ 10, mask of choice, humidifier, supplies, AirView/ card  Please call if we can help

## 2018-08-24 NOTE — Assessment & Plan Note (Signed)
Benefits from BIPAP and will continue. Reviewed compliance goals.  Plan- continue BIPAP 14/10

## 2018-08-24 NOTE — Assessment & Plan Note (Signed)
If accurate, he has lost 20 lbs in past month, with a long way to go. Any progress is encouraged.

## 2018-08-30 DIAGNOSIS — J849 Interstitial pulmonary disease, unspecified: Secondary | ICD-10-CM | POA: Diagnosis not present

## 2018-08-30 DIAGNOSIS — R0602 Shortness of breath: Secondary | ICD-10-CM | POA: Diagnosis not present

## 2018-08-31 DIAGNOSIS — R0602 Shortness of breath: Secondary | ICD-10-CM | POA: Diagnosis not present

## 2018-08-31 DIAGNOSIS — I272 Pulmonary hypertension, unspecified: Secondary | ICD-10-CM | POA: Diagnosis not present

## 2018-09-04 ENCOUNTER — Telehealth: Payer: Self-pay | Admitting: Internal Medicine

## 2018-09-04 NOTE — Telephone Encounter (Signed)
Called and spoke with pt and stated to pt that I did see that a letter was written after OV 7/23 for him to return back to work without any restrictions. Pt said that due to his FMLA, his job was needing another letter sating the date of Sept 7 for him to return back to work. Stated to pt that I would type the letter up and we would then fax it to the provided fax number by him. Pt verbalized understanding.  Letter has been typed up and has been faxed for pt. Nothing further needed.

## 2018-09-11 DIAGNOSIS — M25562 Pain in left knee: Secondary | ICD-10-CM | POA: Diagnosis not present

## 2018-09-11 DIAGNOSIS — M545 Low back pain: Secondary | ICD-10-CM | POA: Diagnosis not present

## 2018-10-01 DIAGNOSIS — I272 Pulmonary hypertension, unspecified: Secondary | ICD-10-CM | POA: Diagnosis not present

## 2018-10-01 DIAGNOSIS — R0602 Shortness of breath: Secondary | ICD-10-CM | POA: Diagnosis not present

## 2018-10-12 DIAGNOSIS — I272 Pulmonary hypertension, unspecified: Secondary | ICD-10-CM | POA: Diagnosis not present

## 2018-10-12 DIAGNOSIS — G4733 Obstructive sleep apnea (adult) (pediatric): Secondary | ICD-10-CM | POA: Diagnosis not present

## 2018-10-19 DIAGNOSIS — I5030 Unspecified diastolic (congestive) heart failure: Secondary | ICD-10-CM | POA: Diagnosis not present

## 2018-10-19 DIAGNOSIS — I50810 Right heart failure, unspecified: Secondary | ICD-10-CM | POA: Diagnosis not present

## 2018-10-19 DIAGNOSIS — Z6841 Body Mass Index (BMI) 40.0 and over, adult: Secondary | ICD-10-CM | POA: Diagnosis not present

## 2018-10-19 DIAGNOSIS — E662 Morbid (severe) obesity with alveolar hypoventilation: Secondary | ICD-10-CM | POA: Diagnosis not present

## 2018-10-19 DIAGNOSIS — G4730 Sleep apnea, unspecified: Secondary | ICD-10-CM | POA: Diagnosis not present

## 2018-10-20 DIAGNOSIS — R0602 Shortness of breath: Secondary | ICD-10-CM | POA: Diagnosis not present

## 2018-10-20 DIAGNOSIS — I50810 Right heart failure, unspecified: Secondary | ICD-10-CM | POA: Diagnosis not present

## 2018-10-20 DIAGNOSIS — I5030 Unspecified diastolic (congestive) heart failure: Secondary | ICD-10-CM | POA: Diagnosis not present

## 2018-10-31 DIAGNOSIS — R0602 Shortness of breath: Secondary | ICD-10-CM | POA: Diagnosis not present

## 2018-10-31 DIAGNOSIS — I272 Pulmonary hypertension, unspecified: Secondary | ICD-10-CM | POA: Diagnosis not present

## 2018-12-01 DIAGNOSIS — R0602 Shortness of breath: Secondary | ICD-10-CM | POA: Diagnosis not present

## 2018-12-01 DIAGNOSIS — I272 Pulmonary hypertension, unspecified: Secondary | ICD-10-CM | POA: Diagnosis not present

## 2018-12-07 DIAGNOSIS — K439 Ventral hernia without obstruction or gangrene: Secondary | ICD-10-CM | POA: Diagnosis not present

## 2018-12-07 DIAGNOSIS — N529 Male erectile dysfunction, unspecified: Secondary | ICD-10-CM | POA: Diagnosis not present

## 2018-12-07 DIAGNOSIS — I5032 Chronic diastolic (congestive) heart failure: Secondary | ICD-10-CM | POA: Diagnosis not present

## 2018-12-17 DIAGNOSIS — E662 Morbid (severe) obesity with alveolar hypoventilation: Secondary | ICD-10-CM | POA: Diagnosis not present

## 2018-12-17 DIAGNOSIS — I272 Pulmonary hypertension, unspecified: Secondary | ICD-10-CM | POA: Diagnosis not present

## 2018-12-31 ENCOUNTER — Ambulatory Visit: Payer: BC Managed Care – PPO | Admitting: Internal Medicine

## 2018-12-31 DIAGNOSIS — R0602 Shortness of breath: Secondary | ICD-10-CM | POA: Diagnosis not present

## 2018-12-31 DIAGNOSIS — I272 Pulmonary hypertension, unspecified: Secondary | ICD-10-CM | POA: Diagnosis not present

## 2019-01-31 DIAGNOSIS — I272 Pulmonary hypertension, unspecified: Secondary | ICD-10-CM | POA: Diagnosis not present

## 2019-01-31 DIAGNOSIS — R0602 Shortness of breath: Secondary | ICD-10-CM | POA: Diagnosis not present

## 2020-09-28 IMAGING — CR DG CHEST 2V
2 series · 2 of 2 positions shown · non-contrast
Comparison: 12/25/2017

CLINICAL DATA: Cough and congestion with left anterior chest pain
for 2 weeks.

EXAM:
CHEST - 2 VIEW

[w chest pa]
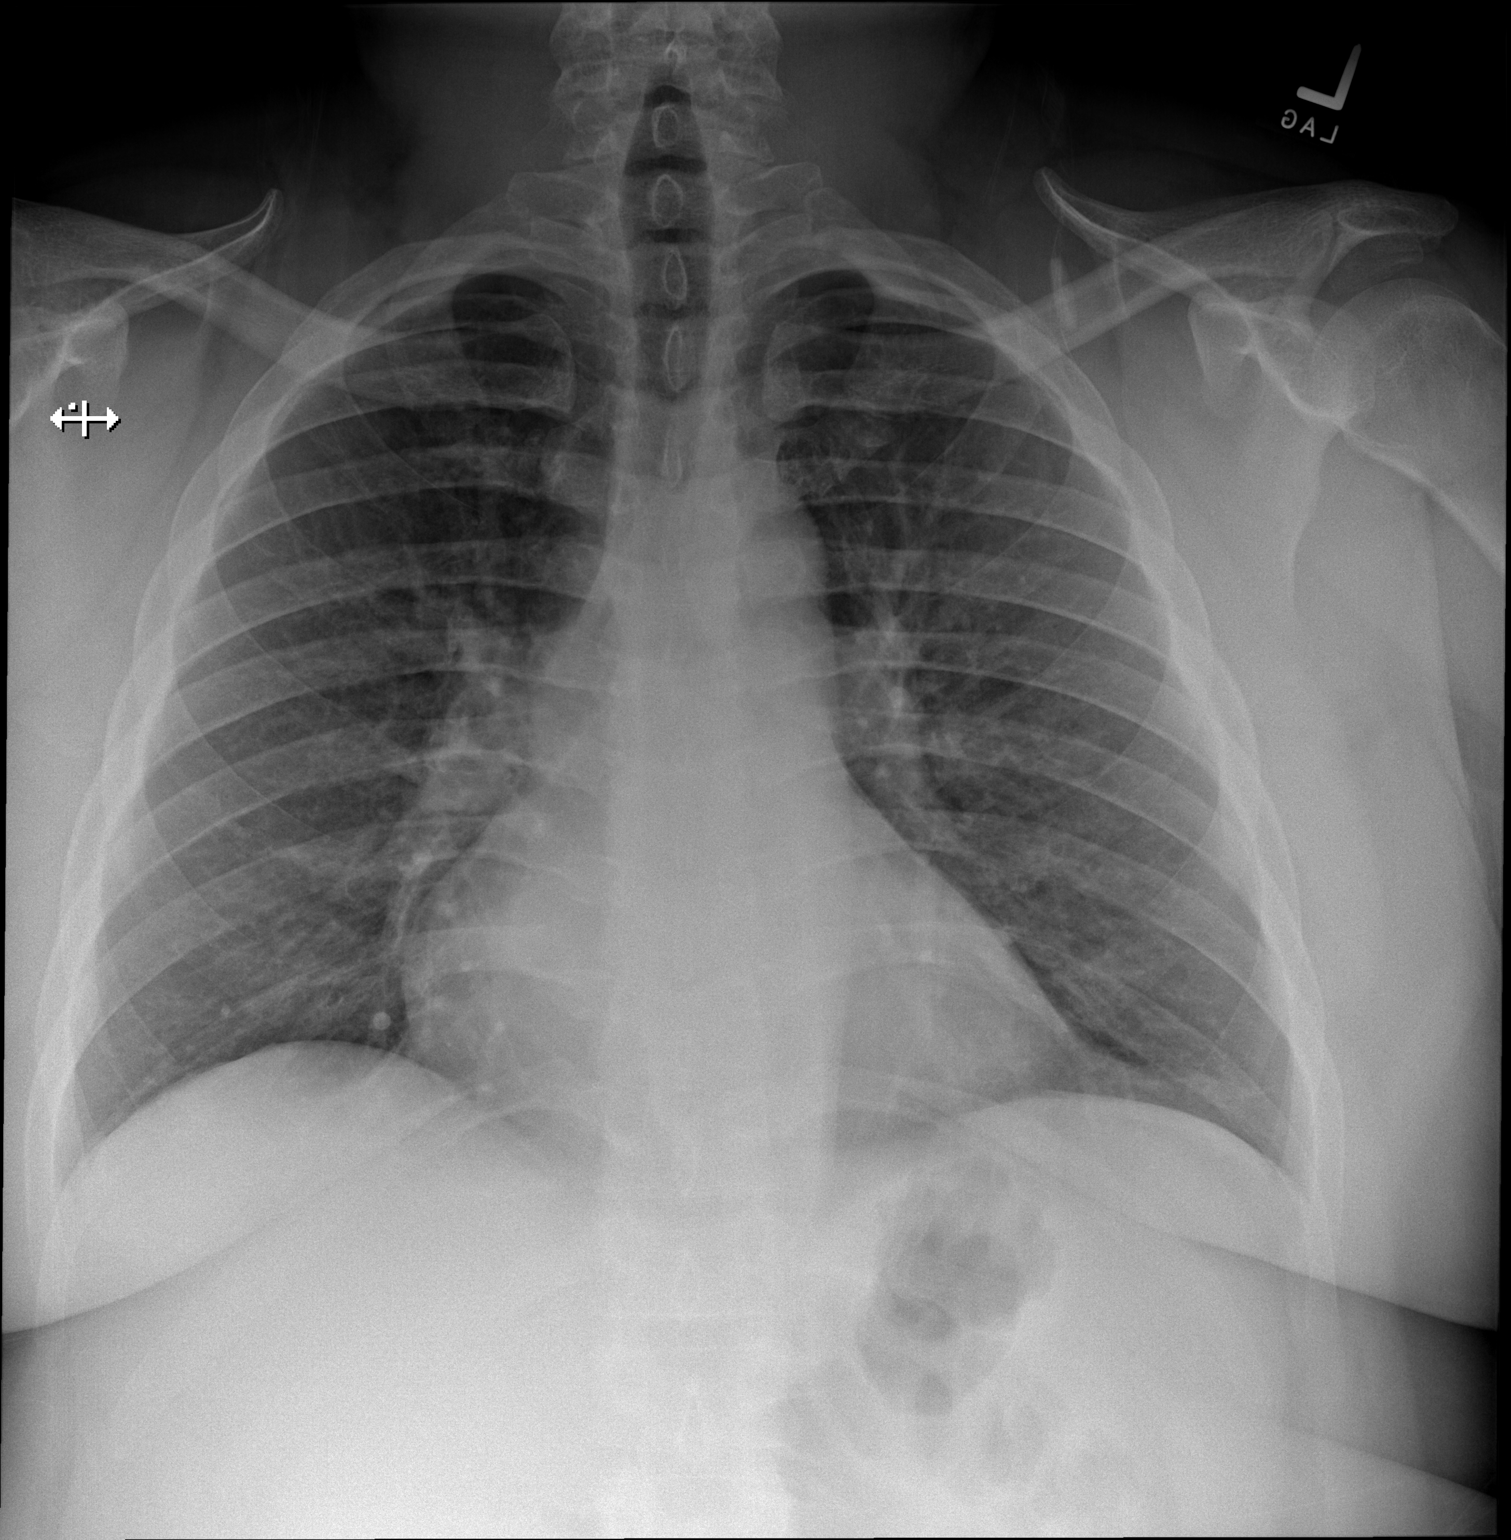

[w chest lat]
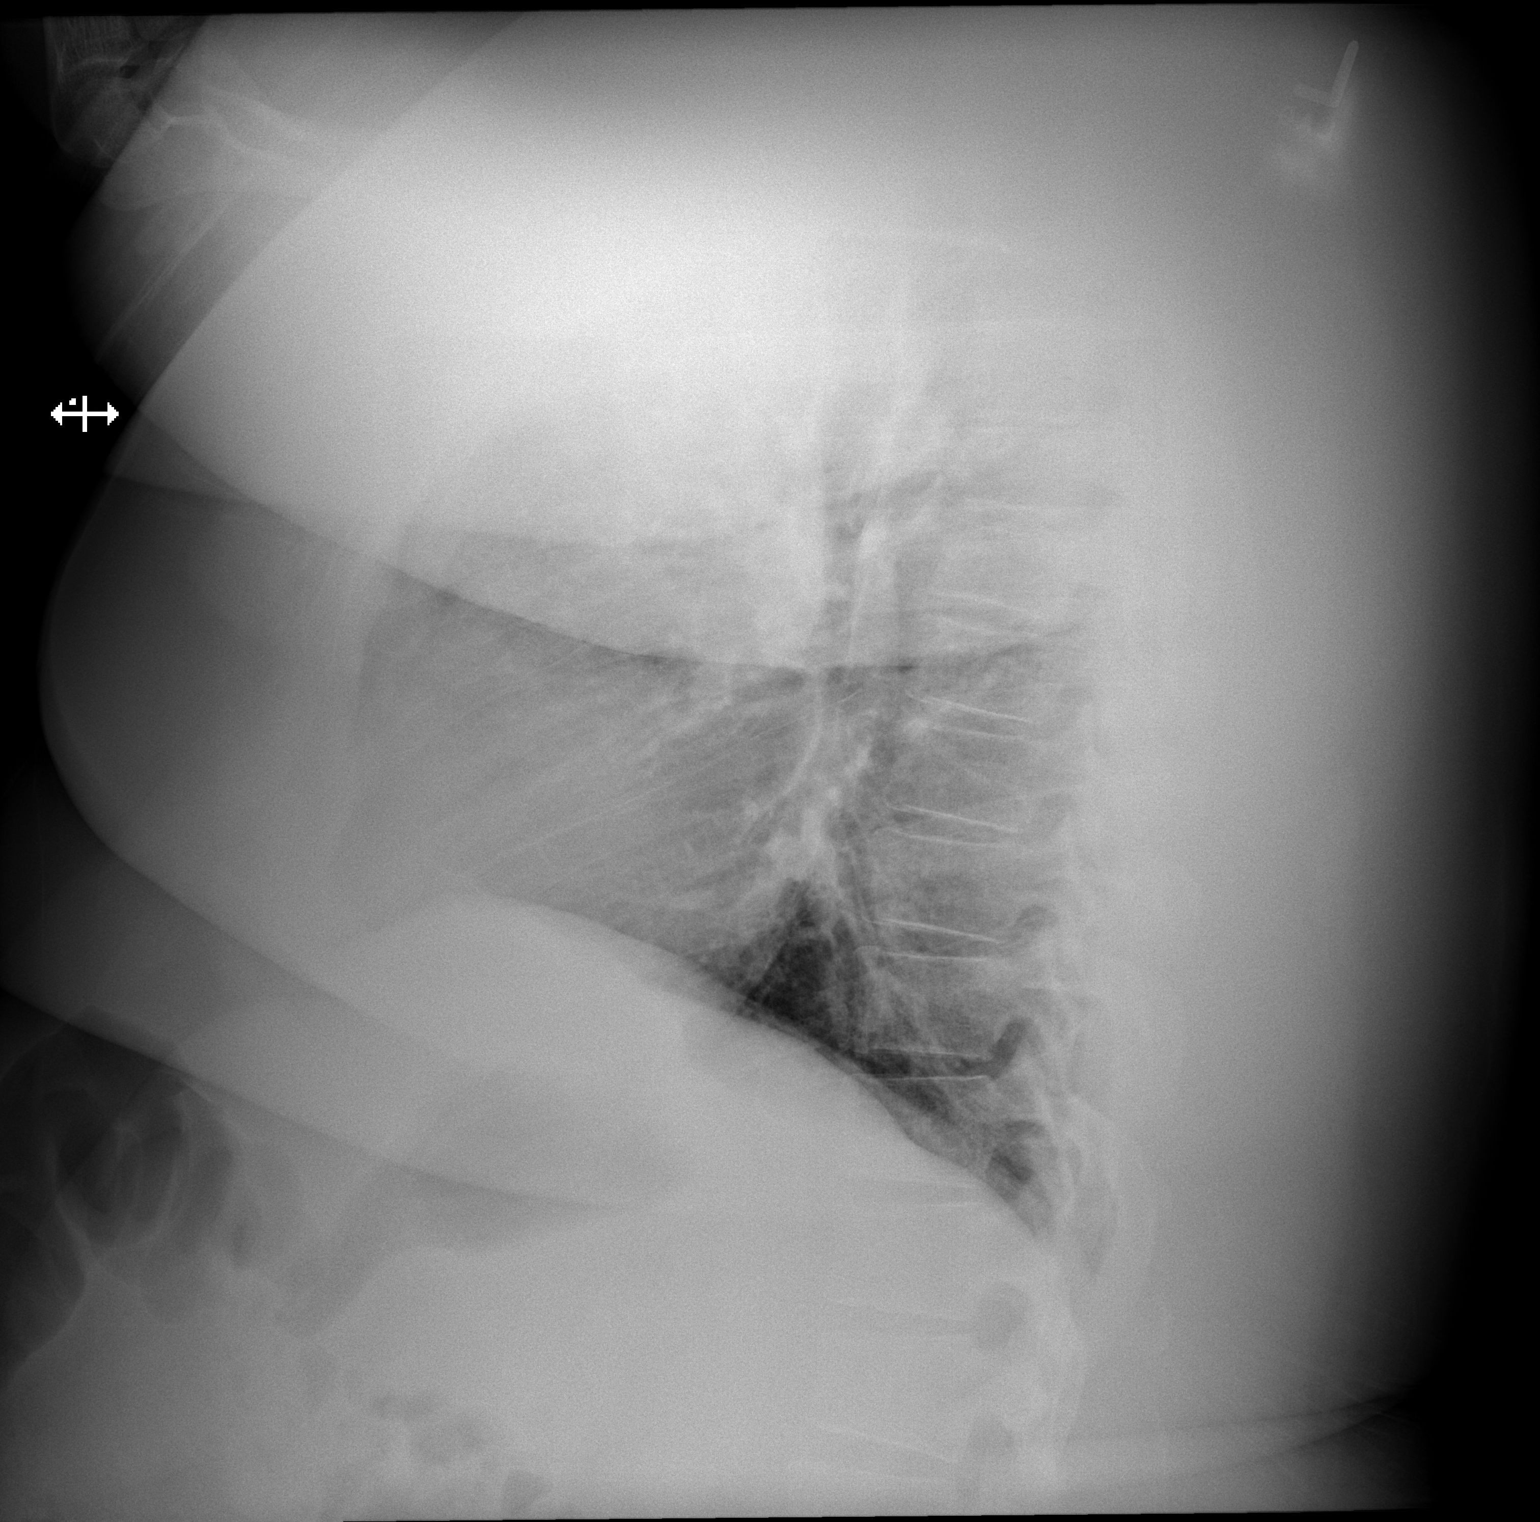

[2 of 2 positions shown; findings below may reference images not displayed]

FINDINGS: Borderline cardiomegaly, stable in appearance. Nonaneurysmal
thoracic aorta. No acute pulmonary consolidation, effusion or edema.
No acute osseous abnormality.
IMPRESSION: No active cardiopulmonary disease.

## 2021-02-24 ENCOUNTER — Emergency Department (HOSPITAL_BASED_OUTPATIENT_CLINIC_OR_DEPARTMENT_OTHER): Payer: Managed Care, Other (non HMO) | Admitting: Radiology

## 2021-02-24 ENCOUNTER — Other Ambulatory Visit: Payer: Self-pay

## 2021-02-24 ENCOUNTER — Encounter (HOSPITAL_BASED_OUTPATIENT_CLINIC_OR_DEPARTMENT_OTHER): Payer: Self-pay | Admitting: Obstetrics and Gynecology

## 2021-02-24 ENCOUNTER — Emergency Department (HOSPITAL_BASED_OUTPATIENT_CLINIC_OR_DEPARTMENT_OTHER)
Admission: EM | Admit: 2021-02-24 | Discharge: 2021-02-24 | Disposition: A | Discharge status: Home / Self Care | Payer: Managed Care, Other (non HMO) | Attending: Emergency Medicine | Admitting: Emergency Medicine

## 2021-02-24 DIAGNOSIS — R0902 Hypoxemia: Secondary | ICD-10-CM

## 2021-02-24 DIAGNOSIS — J029 Acute pharyngitis, unspecified: Secondary | ICD-10-CM | POA: Diagnosis present

## 2021-02-24 DIAGNOSIS — Z20822 Contact with and (suspected) exposure to covid-19: Secondary | ICD-10-CM | POA: Insufficient documentation

## 2021-02-24 DIAGNOSIS — R059 Cough, unspecified: Secondary | ICD-10-CM | POA: Insufficient documentation

## 2021-02-24 LAB — GROUP A STREP BY PCR: Group A Strep by PCR: NOT DETECTED

## 2021-02-24 LAB — RESP PANEL BY RT-PCR (FLU A&B, COVID) ARPGX2
Influenza A by PCR: NEGATIVE
Influenza B by PCR: NEGATIVE
SARS Coronavirus 2 by RT PCR: NEGATIVE

## 2021-02-24 MED ORDER — PREDNISONE 20 MG PO TABS: 20.0000 mg | ORAL_TABLET | Freq: Every day | ORAL | 0 refills | Status: DC

## 2021-02-24 MED ORDER — IPRATROPIUM-ALBUTEROL 0.5-2.5 (3) MG/3ML IN SOLN
3.0000 mL | Freq: Once | RESPIRATORY_TRACT | Status: AC
Start: 2021-02-24 — End: 2021-02-24
  Administered 2021-02-24: 18:00:00 3 mL via RESPIRATORY_TRACT
  Filled 2021-02-24: qty 3

## 2021-02-24 MED ORDER — AEROCHAMBER PLUS FLO-VU LARGE MISC
1.0000 | Freq: Once | Status: AC
  Administered 2021-02-24: 18:00:00 1
  Filled 2021-02-24: qty 1

## 2021-02-24 MED ORDER — ALBUTEROL SULFATE HFA 108 (90 BASE) MCG/ACT IN AERS
2.0000 | INHALATION_SPRAY | RESPIRATORY_TRACT | Status: DC | PRN
  Administered 2021-02-24: 18:00:00 2 via RESPIRATORY_TRACT
  Filled 2021-02-24: qty 6.7

## 2021-02-24 NOTE — Discharge Instructions (Addendum)
Your oxygen level is little low.  It is improved.  The steroids should help with that too.  Use inhaler as needed.  Follow-up with your doctor in the next couple days.

## 2021-02-24 NOTE — ED Triage Notes (Signed)
Patient reports sore throat starting x3 days. Reports he feels like the pain in the throat is worsening. Endorses cough as well.

## 2021-02-24 NOTE — ED Notes (Signed)
Patient educated on the use of MDI w/ and w/o spacer. Patient demonstrated good effort and understanding.

## 2021-02-24 NOTE — ED Notes (Signed)
Patient O2 reading 86-89% on the monitor prior to discharge. Secondary pulse ox used to verify sat and both readings matched. Dr. Rubin Payor made aware and requested the patient be ambulated to assess O2. Patient O2 remained 85-87% throughout most of ambulation and dropped to high 70's-low 80's. Patient again verified on two machines and O2 sats similar. Patient O2 increased to 92-93% at rest when returned to room. Patient denies any shob during this encounter and does not appear in distress. MD made aware of results. Medic, Zella Ball, also made aware.

## 2021-02-24 NOTE — ED Provider Notes (Signed)
[facility] EMERGENCY DEPT Provider Note   CSN: [id number] Arrival date & time: 02/24/21  1532     History  Chief Complaint  Patient presents with   Sore Throat    James Levine is a 36 y.o. male.   Sore Throat Pertinent negatives include no abdominal pain and no shortness of breath. Patient present sore throat and low bit of cough.  Has had for around the last 3 days.  Feels some nasal drainage.  Also occasional cough minimal sputum production.  Feels if he is coughing up the drainage from his nose.  Still able to swallow which is just some pain with it.  No known sick contacts.  Has been vaccinated for COVID and flu.  Previous history of pulmonary hypertension thought related to obstructive sleep apnea.   Past Medical History:  Diagnosis Date   Morbid obesity (HCC)         Home Medications Prior to Admission medications   Medication Sig Start Date End Date Taking? Authorizing Provider  predniSONE (DELTASONE) 20 MG tablet Take 1 tablet (20 mg total) by mouth daily. 02/24/21  Yes Benjiman Core, MD  acetaminophen (TYLENOL) 325 MG tablet Take 2 tablets (650 mg total) by mouth every 6 (six) hours as needed for mild pain (or Fever >/= 101). Patient not taking: Reported on 08/23/2018 05/09/18   Lanae Boast, MD  Fluticasone-Umeclidin-Vilant (TRELEGY ELLIPTA) 100-62.5-25 MCG/INH AEPB Inhale 1 puff into the lungs daily. 05/24/18   Waymon Budge, MD  lisinopril (ZESTRIL) 5 MG tablet Take by mouth. 07/01/18   [provider]  spironolactone (ALDACTONE) 25 MG tablet Take by mouth. 07/01/18   [provider]      Allergies    Patient has no known allergies.    Review of Systems   Review of Systems  Constitutional:  Negative for appetite change and diaphoresis.  HENT:  Positive for congestion and sore throat.   Respiratory:  Positive for cough. Negative for shortness of breath.   Cardiovascular:  Negative for leg swelling.  Gastrointestinal:   Negative for abdominal pain.  Genitourinary:  Negative for flank pain.  Musculoskeletal:  Negative for back pain.  Skin:  Negative for rash.  Neurological:  Negative for weakness.   Physical Exam Updated Vital Signs BP 119/74    Pulse 92    Temp 99 F (37.2 C)    Resp 18    SpO2 (S) 96% Comment: Post neb Physical Exam Vitals and nursing note reviewed.  HENT:     Head: Normocephalic.     Mouth/Throat:     Mouth: Mucous membranes are moist.     Comments: Posterior pharyngeal erythema without exudate. Cardiovascular:     Rate and Rhythm: Normal rate and regular rhythm.  Pulmonary:     Breath sounds: No wheezing, rhonchi or rales.  Chest:     Chest wall: No tenderness.  Abdominal:     Palpations: Abdomen is soft.  Skin:    General: Skin is warm.     Capillary Refill: Capillary refill takes less than 2 seconds.  Neurological:     Mental Status: He is alert.    ED Results / Procedures / Treatments   Labs (all labs ordered are listed, but only abnormal results are displayed) Labs Reviewed  GROUP A STREP BY PCR  RESP PANEL BY RT-PCR (FLU A&B, COVID) [id number]    EKG None  Radiology DG Chest 1 View  Result Date: 02/24/2021 CLINICAL DATA:  Cough EXAM: CHEST  1  VIEW COMPARISON:  Chest x-ray dated May 07, 2018 FINDINGS: Unchanged cardiomegaly. Lungs are clear. No large pleural effusion or pneumothorax. IMPRESSION: No active disease. Electronically Signed   By: Allegra Lai M.D.   On: 02/24/2021 15:57    Procedures Procedures    Medications Ordered in ED Medications  albuterol (VENTOLIN HFA) 108 (90 Base) MCG/ACT inhaler 2 puff (2 puffs Inhalation Provided for home use 02/24/21 1812)  ipratropium-albuterol (DUONEB) 0.5-2.5 (3) MG/3ML nebulizer solution 3 mL (3 mLs Nebulization Given 02/24/21 1750)  AeroChamber Plus Flo-Vu Large MISC 1 each (1 each Other Given 02/24/21 1817)    ED Course/ Medical Decision Making/ A&P                           Medical Decision  Making Problems Addressed: Cough: acute illness or injury Hypoxia: acute illness or injury Pharyngitis, unspecified etiology: acute illness or injury  Amount and/or Complexity of Data Reviewed Radiology: ordered.  Risk Prescription drug management.   Patient presents with URI symptoms sore throat.  Said for the last few days.  Occasional cough.  Nasal congestion.  Chest x-ray reassuring.  Independently interpreted by me.  CT scan not indicated at this time.  Also negative flu COVID and strep testing.  Sore throat.  Patient did have some hypoxia here.  History of interstitial lung disease.  Improved after breathing treatment.  Will give steroids to help with her throat and lung findings.  We will follow-up in the next couple days.  Does not appear to be in heart failure at this time.  History of pulmonary hypertension.  Appears to discharge home.           Final Clinical Impression(s) / ED Diagnoses Final diagnoses:  Cough  Pharyngitis, unspecified etiology  Hypoxia    Rx / DC Orders ED Discharge Orders          Ordered    predniSONE (DELTASONE) 20 MG tablet  Daily        02/24/21 1703              Benjiman Core, MD 02/24/21 [id number]

## 2021-09-23 ENCOUNTER — Encounter: Payer: Self-pay | Admitting: Internal Medicine
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
## 2021-09-28 ENCOUNTER — Encounter: Payer: Self-pay | Admitting: Internal Medicine

## 2021-09-28 ENCOUNTER — Ambulatory Visit (INDEPENDENT_AMBULATORY_CARE_PROVIDER_SITE_OTHER): Payer: 59 | Admitting: Internal Medicine

## 2021-09-28 VITALS — BP 140/88 | HR 88 | Ht 69.0 in | Wt 352.8 lb

## 2021-09-28 DIAGNOSIS — G4734 Idiopathic sleep related nonobstructive alveolar hypoventilation: Secondary | ICD-10-CM | POA: Diagnosis not present

## 2021-09-28 DIAGNOSIS — I270 Primary pulmonary hypertension: Secondary | ICD-10-CM

## 2021-09-28 DIAGNOSIS — G4733 Obstructive sleep apnea (adult) (pediatric): Secondary | ICD-10-CM | POA: Diagnosis not present

## 2021-09-28 NOTE — Patient Instructions (Signed)
Order- DME [facility]- increase BIPAP to 16/12, continue O2 through BIPAP at 2 liters, continue mask of choice, humidifier, supplies, AirView/ card  Order- D[facility]- after BIPAP pressure is changed, please do Overnight Oximetry on BIPAP with O2 2L    dx Pulmonary Hypertension  It will help if you can get your weight down

## 2021-09-29 ENCOUNTER — Encounter: Payer: Self-pay | Admitting: Internal Medicine

## 2021-09-29 DIAGNOSIS — G4734 Idiopathic sleep related nonobstructive alveolar hypoventilation: Secondary | ICD-10-CM | POA: Insufficient documentation

## 2021-09-29 NOTE — Assessment & Plan Note (Signed)
Weight loss would likely help his respiratory problems, and is encouraged.

## 2021-09-29 NOTE — Assessment & Plan Note (Signed)
BiPAP remains helpful with good compliance.  I would like to raise pressures a little for better control. Plan-increase BiPAP to 16/12.

## 2021-09-29 NOTE — Assessment & Plan Note (Signed)
Primary problem is thought to be obesity hypoventilation syndrome Plan-after BiPAP pressure adjusted to 16/12 we will check overnight oximetry on BiPAP +2 L oxygen.

## 2021-10-12 ENCOUNTER — Other Ambulatory Visit: Payer: Self-pay

## 2021-10-12 DIAGNOSIS — G4734 Idiopathic sleep related nonobstructive alveolar hypoventilation: Secondary | ICD-10-CM

## 2021-10-12 DIAGNOSIS — G4733 Obstructive sleep apnea (adult) (pediatric): Secondary | ICD-10-CM

## 2021-12-31 NOTE — Progress Notes (Unsigned)
HPI James Levine never smoker followed for OSA, complicated by  Morbid Obesity, Hypoxic Respiratory Failure (4/ 2020) with rib fx's attributed to cough. GERD with Cough, pulmonary hypertension, HST 07/10/2018- AHI 62.3/ hr, desaturation to 42%/ Mean sat 80%, body weight 368 lbs Pulmonary consultation with [facility] Pulmonary 01/01/21 with concern of Pulmonary Hypertension : Ambulated 427 meters and did not require supplemental oxygen. Pertinent Laboratory Data:  07/13/2018: BNP 16.3 06/29/2018: VBG: 7.34 76 07/12/2018: BMP: 139 / 4.6 / 101 / 33 / 12 / 1.02 / 90 06/22/2018: ANCA, ANA, Myeloperoxidase Ab, Proteinase 3, Rf all negative Pertinent PH Data: ECHO (06/18/2018): Normal LV function (EF 60-65%); no valvular/pericardial disease; TRV 322; estimated RVSP 56.5; dilated RV with reduced systolic function Echo/TEE (bubble study, 06/19/2018): severe R->L shunt detected with a PFO RHC (06/28/2018): RAP 5 mmHg; mPAP 30 mmHg; PCWP 21 mmHg; PVR 1.39 Wood units; preserved CO V/Q scan: Not performed CTA Chest (06/19/2018): no evidence of pulmonary embolizme IMPRESSION: Pulmonary hypertension (WHO Group 3, NYHA class 1)   attributed to OHS exxpected to improve on BIPAP/ weight loss. -- encouraged ongoing weight loss -- encouraged ongoing compliance with nocturnal bipap ------------------------------------------------------------------------- .  09/28/21- 36 yo James Levine former smoker  Coming to Re-establish-followed for OSA, complicated by  Morbid Obesity, Acute hypoxic Respiratory Failure (4/ 2020) with rib fx's attributed to cough. GERD with Cough -Trelegy 100,  HST 07/10/2018- AHI 62.3/ hr, desaturation to 42%/ Mean sat 80%, body weight 368 lbs BIPAP 14/ 10/ [facility]    Using AirCurve 10 VAUTO Has O2 concentrator attached to his BIPAP, but not using O2 Body weight today 352 lbs Download compliance 100%, AHI 11.8/ hr Pulmonary consultation with [facility] Pulmonary 01/01/21 with concern of Pulmonary Hypertension : Ambulated 427  meters and did not require supplemental oxygen. Pertinent Laboratory Data:  07/13/2018: BNP 16.3 06/29/2018: VBG: 7.34 76 07/12/2018: BMP: 139 / 4.6 / 101 / 33 / 12 / 1.02 / 90 06/22/2018: ANCA, ANA, Myeloperoxidase Ab, Proteinase 3, Rf all negative Pertinent PH Data: ECHO (06/18/2018): Normal LV function (EF 60-65%); no valvular/pericardial disease; TRV 322; estimated RVSP 56.5; dilated RV with reduced systolic function Echo/TEE (bubble study, 06/19/2018): severe R->L shunt detected with a PFO RHC (06/28/2018): RAP 5 mmHg; mPAP 30 mmHg; PCWP 21 mmHg; PVR 1.39 Wood units; preserved CO V/Q scan: Not performed CTA Chest (06/19/2018): no evidence of pulmonary embolizme IMPRESSION: Pulmonary hypertension (WHO Group 3, NYHA class 1)   attributed to OHS exxpected to improve on BIPAP/ weight loss. -- encouraged ongoing weight loss -- encouraged ongoing compliance with nocturnal bipap  ENT/ [facility] 04/19/21- GERD/LPR  -----Pt reports he is sleeping well and gets 5-7hrs/night of sleep. BiPAP seems to be working well w/ approx 2L bled-in He comes for follow-up now asking what to do about his oxygen which is bled- in through his BiPAP machine at 2 L. Chokes occasionally with eating or drink we discussed diagnosis of GERD mated his ENT visit. Download reviewed and we discussed trialing higher pressure.  We will check overnight oximetry with adjusted BiPAP plus oxygen. We discussed potential contribution of postnasal drip from his allergic rhinitis, and LPR from his GERD as related to his occasional dry cough and choking.  Addendum 10/12/21- Overnight oximetry on BIPAP 16/12 with O2 2L  done 10/07/21- shows desaturation </= 88% for 73 minutes with 188 ODI events. Plan- ordering DME [facility] to increase BIPAP to 18/14 and increase O2 to 3L.  01/03/22-  36 yo James Levine former smoker, works as Investment banker, operational,   Scientist, water quality  to Re-establish-followed for OSA, complicated by  Morbid Obesity, Hypoxic Respiratory Failure (4/ 2020) with rib fx's  attributed to cough. Pulmonary Hypertension (WHO 3),  GERD,  - BIPAP 16/12/ [facility]     O2 3l/ [facility]     Started with hosp at [facility] in 2020- OSA/OHS. Download compliance 83%, AHI 6.3/ hr Body weight today-361 lbs Covid vax- 2 Moderna Flu vax- No He stopped his BP meds.   ROS-see HPI   + = positive Constitutional:    weight loss, night sweats, fevers, chills, fatigue, lassitude. HEENT:    headaches, difficulty swallowing, tooth/dental problems, sore throat,       sneezing, itching, ear ache, nasal congestion, post nasal drip, snoring CV:    chest pain, orthopnea, PND, swelling in lower extremities, anasarca,                                   dizziness, palpitations Resp:   shortness of breath with exertion or at rest.                +productive cough,   non-productive cough, coughing up of blood.              change in color of mucus.  wheezing.   Skin:    rash or lesions. GI:  No-   heartburn, indigestion, abdominal pain, nausea, vomiting, diarrhea,                 change in bowel habits, loss of appetite GU: dysuria, change in color of urine, no urgency or frequency.   flank pain. MS:   joint pain, stiffness, decreased range of motion, back pain. Neuro-     nothing unusual Psych:  change in mood or affect.  depression or anxiety.   memory loss.  OBJ- Physical Exam General- Alert, Oriented, Affect-appropriate, Distress- none acute, + morbid obesity Skin- rash-none, lesions- none, excoriation- none Lymphadenopathy- none Head- atraumatic            Eyes- Gross vision intact, PERRLA, conjunctivae and secretions clear            Ears- Hearing, canals-normal            Nose- Clear, no-Septal dev, mucus, polyps, erosion, perforation             Throat- Mallampati IV , mucosa clear , drainage- none, tonsils- atrophic, +teeth Neck- flexible , trachea midline, no stridor , thyroid nl, carotid no bruit Chest - symmetrical excursion , unlabored           Heart/CV- RRR , no murmur , no gallop   , no rub, nl s1 s2                           - JVD- none , edema- none, stasis changes- none, varices- none           Lung- clear to P&A, wheeze- none, cough- none , dullness-none, rub- none           Chest wall-  Abd-  Br/ Gen/ Rectal- Not done, not indicated Extrem- cyanosis- none, clubbing, none, atrophy- none, strength- nl Neuro- grossly intact to observation

## 2022-01-03 ENCOUNTER — Ambulatory Visit (INDEPENDENT_AMBULATORY_CARE_PROVIDER_SITE_OTHER): Payer: Managed Care, Other (non HMO) | Admitting: Internal Medicine

## 2022-01-03 ENCOUNTER — Encounter: Payer: Self-pay | Admitting: Internal Medicine

## 2022-01-03 VITALS — BP 140/95 | HR 89 | Temp 98.3°F | Ht 69.0 in | Wt 361.4 lb

## 2022-01-03 DIAGNOSIS — I270 Primary pulmonary hypertension: Secondary | ICD-10-CM

## 2022-01-03 DIAGNOSIS — G4734 Idiopathic sleep related nonobstructive alveolar hypoventilation: Secondary | ICD-10-CM | POA: Diagnosis not present

## 2022-01-03 DIAGNOSIS — G4733 Obstructive sleep apnea (adult) (pediatric): Secondary | ICD-10-CM | POA: Diagnosis not present

## 2022-01-03 NOTE — Assessment & Plan Note (Signed)
Benefits from BIPAP. More comfortable since pressure reduced to 16/12 Plan- continue BIPAP with O2

## 2022-01-03 NOTE — Patient Instructions (Signed)
We can continue O2 3L and BIPAP 16/12 through [facility]  We will get you a phone number so you can call for help getting a primary care provider in [place]

## 2022-01-03 NOTE — Assessment & Plan Note (Signed)
Continues dependent on sleep O2 3L through BIPAP due to OSA and OHS Plan- no change

## 2022-01-03 NOTE — Assessment & Plan Note (Signed)
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

## 2022-05-30 ENCOUNTER — Encounter (HOSPITAL_BASED_OUTPATIENT_CLINIC_OR_DEPARTMENT_OTHER): Payer: Self-pay

## 2022-05-30 ENCOUNTER — Emergency Department (HOSPITAL_BASED_OUTPATIENT_CLINIC_OR_DEPARTMENT_OTHER)
Admission: EM | Admit: 2022-05-30 | Discharge: 2022-05-30 | Disposition: A | Discharge status: Home / Self Care | Payer: Managed Care, Other (non HMO) | Attending: Emergency Medicine | Admitting: Emergency Medicine

## 2022-05-30 ENCOUNTER — Other Ambulatory Visit: Payer: Self-pay

## 2022-05-30 ENCOUNTER — Emergency Department (HOSPITAL_BASED_OUTPATIENT_CLINIC_OR_DEPARTMENT_OTHER): Payer: Managed Care, Other (non HMO) | Admitting: Radiology

## 2022-05-30 DIAGNOSIS — L03031 Cellulitis of right toe: Secondary | ICD-10-CM | POA: Insufficient documentation

## 2022-05-30 DIAGNOSIS — M79674 Pain in right toe(s): Secondary | ICD-10-CM

## 2022-05-30 MED ORDER — CEPHALEXIN 500 MG PO CAPS: 500.0000 mg | ORAL_CAPSULE | Freq: Four times a day (QID) | ORAL | 0 refills | Status: DC

## 2022-05-30 MED ORDER — IBUPROFEN 600 MG PO TABS: 600.0000 mg | ORAL_TABLET | Freq: Four times a day (QID) | ORAL | 0 refills | Status: AC | PRN

## 2022-05-30 NOTE — ED Triage Notes (Signed)
Patient here POV from Home.  Endorses Right Great toe Swelling and pain that was noted Saturday AM. No known Injury or Trauma. No fevers.  NAD Noted during Triage. A&Ox4. GCS 15. Ambulatory.

## 2022-05-30 NOTE — ED Notes (Signed)
Pt discharged to home using teachback Method. Discharge instructions have been discussed with patient and/or family members. Pt verbally acknowledges understanding d/c instructions, has been given opportunity for questions to be answered, and endorses comprehension to checkout at registration before leaving.  

## 2022-05-30 NOTE — Discharge Instructions (Addendum)
Please take your medications as prescribed. Take tylenol/ibuprofen for pain. I recommend close follow-up with PCP for reevaluation.  Please do not hesitate to return to emergency department if worrisome signs symptoms we discussed become apparent.  

## 2022-05-30 NOTE — ED Provider Notes (Signed)
[place] EMERGENCY DEPARTMENT AT [facility] Provider Note   CSN: [id number] Arrival date & time: 05/30/22  1315     History  Chief Complaint  Patient presents with   Toe Pain    James Levine is a 37 y.o. male presents today for evaluation of toe pain.  Patient states he noticed pain and swelling to his right great toe since Saturday morning.  He denies any injury to his toe.  Denies history of DVT/PE.  No pain on his leg.  Right big toe is painful to touch.  No fever.  He denies any loss of sensation to his right great toe.  Toe Pain      Past Medical History:  Diagnosis Date   Morbid obesity (HCC)    History reviewed. No pertinent surgical history.   Home Medications Prior to Admission medications   Medication Sig Start Date End Date Taking? Authorizing Provider  acetaminophen (TYLENOL) 325 MG tablet Take 2 tablets (650 mg total) by mouth every 6 (six) hours as needed for mild pain (or Fever >/= 101). Patient not taking: Reported on 08/23/2018 05/09/18   Lanae Boast, MD  spironolactone (ALDACTONE) 25 MG tablet Take by mouth. Patient not taking: Reported on 09/28/2021 07/01/18   [provider]      Allergies    Grass extracts [gramineae pollens]    Review of Systems   Review of Systems Negative except as per HPI.  Physical Exam Updated Vital Signs BP (!) 153/103 (BP Location: Right Arm)   Pulse 81   Temp 98.3 F (36.8 C) (Oral)   Resp 18   Ht 5\' 8"  (1.727 m)   Wt (!) 158.8 kg   SpO2 100%   BMI 53.22 kg/m  Physical Exam Vitals and nursing note reviewed.  Constitutional:      Appearance: Normal appearance.  HENT:     Head: Normocephalic and atraumatic.     Mouth/Throat:     Mouth: Mucous membranes are moist.  Eyes:     General: No scleral icterus. Cardiovascular:     Rate and Rhythm: Normal rate and regular rhythm.     Pulses: Normal pulses.     Heart sounds: Normal heart sounds.  Pulmonary:     Effort: Pulmonary effort is normal.      Breath sounds: Normal breath sounds.  Abdominal:     General: Abdomen is flat.     Palpations: Abdomen is soft.     Tenderness: There is no abdominal tenderness.  Musculoskeletal:        General: No deformity.     Comments: Right great toe with mild swelling and overlying skin erythema.  Positive tenderness to palpation.  Neurovascular function of the right foot intact.  Skin:    General: Skin is warm.     Findings: No rash.  Neurological:     General: No focal deficit present.     Mental Status: He is alert.  Psychiatric:        Mood and Affect: Mood normal.     ED Results / Procedures / Treatments   Labs (all labs ordered are listed, but only abnormal results are displayed) Labs Reviewed - No data to display  EKG None  Radiology DG Foot Complete Right  Result Date: 05/30/2022 CLINICAL DATA:  Right great toe swelling for 2 days.  Pain. EXAM: RIGHT FOOT COMPLETE - 3+ VIEW COMPARISON:  None Available. FINDINGS: Mild hallux valgus. Mild medial and dorsal and minimal lateral great toe metatarsophalangeal and  degenerative spurring. Mild chronic enthesopathic change at the Achilles insertion on the calcaneus. Minimal dorsal talonavicular degenerative spurring. No acute fracture or dislocation. IMPRESSION: Mild hallux valgus and mild great toe metatarsophalangeal joint osteoarthritis. Electronically Signed   By: Neita Garnet M.D.   On: 05/30/2022 14:14    Procedures Procedures    Medications Ordered in ED Medications - No data to display  ED Course/ Medical Decision Making/ A&P                             Medical Decision Making Amount and/or Complexity of Data Reviewed Radiology: ordered.  Risk Prescription drug management.   This patient presents to the ED for toe pain, this involves an extensive number of treatment options, and is a complaint that carries with a high risk of complications and morbidity.  The differential diagnosis includes fracture, dislocation,  cellulitis, gout, arthritis, infectious etiology.  This is not an exhaustive list.  Imaging studies: I ordered imaging studies. I personally reviewed, interpreted imaging and agree with the radiologist's interpretations. The results include: X-ray of right foot with no acute osseous abnormality.  Problem list/ ED course/ Critical interventions/ Medical management: HPI: See above Vital signs within normal range and stable throughout visit. Laboratory/imaging studies significant for: See above. On physical examination, patient is afebrile and appears in no acute distress. Patient presents with R great toe pain. Given history, exam and workup patient likely has cellulitis vs arthritis. I have low suspicion for fracture, dislocation, significant ligamentous injury, septic arthritis, gout flare, new autoimmune arthropathy, or gonococcal arthropathy.  I sent an Rx of Keflex for cellulitis.  Advised patient to take Tylenol/ibuprofen for pain, follow-up with primary care physician for further evaluation.  Strict return precaution discussed.  I have reviewed the patient home medicines and have made adjustments as needed.  Cardiac monitoring/EKG: The patient was maintained on a cardiac monitor.  I personally reviewed and interpreted the cardiac monitor which showed an underlying rhythm of: sinus rhythm.  Additional history obtained: External records from outside source obtained and reviewed including: Chart review including previous notes, labs, imaging.  Consultations obtained:  Disposition Continued outpatient therapy. Follow-up with PCP recommended for reevaluation of symptoms. Treatment plan discussed with patient.  Pt acknowledged understanding was agreeable to the plan. Worrisome signs and symptoms were discussed with patient, and patient acknowledged understanding to return to the ED if they noticed these signs and symptoms. Patient was stable upon discharge.   This chart was dictated using  voice recognition software.  Despite best efforts to proofread,  errors can occur which can change the documentation meaning.          Final Clinical Impression(s) / ED Diagnoses Final diagnoses:  Pain of toe of right foot  Cellulitis of toe of right foot    Rx / DC Orders ED Discharge Orders          Ordered    cephALEXin (KEFLEX) 500 MG capsule  4 times daily        05/30/22 1545    ibuprofen (ADVIL) 600 MG tablet  Every 6 hours PRN        05/30/22 1545              Jeanelle Malling, [place] 05/30/22 1557    Ernie Avena, MD 05/30/22 1743

## 2023-01-08 NOTE — Progress Notes (Unsigned)
HPI M never smoker followed for OSA, complicated by  Morbid Obesity, Hypoxic Respiratory Failure (4/ 2020) with rib fx's attributed to cough. GERD with Cough, pulmonary hypertension, HST 07/10/2018- AHI 62.3/ hr, desaturation to 42%/ Mean sat 80%, body weight 368 lbs Pulmonary consultation with [facility] Pulmonary 01/01/21 with concern of Pulmonary Hypertension : Ambulated 427 meters and did not require supplemental oxygen. Pertinent Laboratory Data:  07/13/2018: BNP 16.3 06/29/2018: VBG: 7.34 76 07/12/2018: BMP: 139 / 4.6 / 101 / 33 / 12 / 1.02 / 90 06/22/2018: ANCA, ANA, Myeloperoxidase Ab, Proteinase 3, Rf all negative Pertinent PH Data: ECHO (06/18/2018): Normal LV function (EF 60-65%); no valvular/pericardial disease; TRV 322; estimated RVSP 56.5; dilated RV with reduced systolic function Echo/TEE (bubble study, 06/19/2018): severe R->L shunt detected with a PFO RHC (06/28/2018): RAP 5 mmHg; mPAP 30 mmHg; PCWP 21 mmHg; PVR 1.39 Wood units; preserved CO V/Q scan: Not performed CTA Chest (06/19/2018): no evidence of pulmonary embolizme IMPRESSION: Pulmonary hypertension (WHO Group 3, NYHA class 1)   attributed to OHS exxpected to improve on BIPAP/ weight loss. -- encouraged ongoing weight loss -- encouraged ongoing compliance with nocturnal bipap ------------------------------------------------------------------------- .  09/28/21- 19 yo M former smoker  Coming to Re-establish-followed for OSA, complicated by  Morbid Obesity, Acute hypoxic Respiratory Failure (4/ 2020) with rib fx's attributed to cough. GERD with Cough -Trelegy 100,  HST 07/10/2018- AHI 62.3/ hr, desaturation to 42%/ Mean sat 80%, body weight 368 lbs BIPAP 14/ 10/ [facility]    Using AirCurve 10 VAUTO Has O2 concentrator attached to his BIPAP, but not using O2 Body weight today 352 lbs Download compliance 100%, AHI 11.8/ hr Pulmonary consultation with [facility] Pulmonary 01/01/21 with concern of Pulmonary Hypertension : Ambulated 427  meters and did not require supplemental oxygen. Pertinent Laboratory Data:  07/13/2018: BNP 16.3 06/29/2018: VBG: 7.34 76 07/12/2018: BMP: 139 / 4.6 / 101 / 33 / 12 / 1.02 / 90 06/22/2018: ANCA, ANA, Myeloperoxidase Ab, Proteinase 3, Rf all negative Pertinent PH Data: ECHO (06/18/2018): Normal LV function (EF 60-65%); no valvular/pericardial disease; TRV 322; estimated RVSP 56.5; dilated RV with reduced systolic function Echo/TEE (bubble study, 06/19/2018): severe R->L shunt detected with a PFO RHC (06/28/2018): RAP 5 mmHg; mPAP 30 mmHg; PCWP 21 mmHg; PVR 1.39 Wood units; preserved CO V/Q scan: Not performed CTA Chest (06/19/2018): no evidence of pulmonary embolizme IMPRESSION: Pulmonary hypertension (WHO Group 3, NYHA class 1)   attributed to OHS exxpected to improve on BIPAP/ weight loss. -- encouraged ongoing weight loss -- encouraged ongoing compliance with nocturnal bipap  ENT/ [facility] 04/19/21- GERD/LPR  -----Pt reports he is sleeping well and gets 5-7hrs/night of sleep. BiPAP seems to be working well w/ approx 2L bled-in He comes for follow-up now asking what to do about his oxygen which is bled- in through his BiPAP machine at 2 L. Chokes occasionally with eating or drink we discussed diagnosis of GERD mated his ENT visit. Download reviewed and we discussed trialing higher pressure.  We will check overnight oximetry with adjusted BiPAP plus oxygen. We discussed potential contribution of postnasal drip from his allergic rhinitis, and LPR from his GERD as related to his occasional dry cough and choking.  Addendum 10/12/21- Overnight oximetry on BIPAP 16/12 with O2 2L  done 10/07/21- shows desaturation </= 88% for 73 minutes with 188 ODI events. Plan- ordering DME [facility] to increase BIPAP to 18/14 and increase O2 to 3L.  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 Pulmonary Hypertension (WHO 3),  GERD,  - BIPAP 16/12/ [facility]     O2 3l/ [facility]     Started with hosp at [facility] in 2020- OSA/OHS. Download compliance 83%, AHI 6.3/ hr Body weight today-361 lbs Covid vax- 2 Moderna Flu vax- No He stopped his BP meds.  01/09/23-37 yo M former smoker  Coming to Re-establish-followed for OSA, complicated by  Morbid Obesity, Pulmonary Hypertension, Acute hypoxic Respiratory Failure (4/ 2020) with rib fx's attributed to cough. GERD with Cough -Trelegy 100,  HST 07/10/2018- AHI 62.3/ hr, desaturation to 42%/ Mean sat 80%, body weight 368 lbs BIPAP 14/ 10/ [facility]    Using AirCurve 10 VAUTO Has O2 concentrator attached to his BIPAP, but not using O2 Body weight today  Download compliance     ROS-see HPI   + = positive Constitutional:    weight loss, night sweats, fevers, chills, fatigue, lassitude. HEENT:    headaches, difficulty swallowing, tooth/dental problems, sore throat,       sneezing, itching, ear ache, nasal congestion, post nasal drip, snoring CV:    chest pain, orthopnea, PND, swelling in lower extremities, anasarca,                                   dizziness, palpitations Resp:   shortness of breath with exertion or at rest.                +productive cough,   non-productive cough, coughing up of blood.              change in color of mucus.  wheezing.   Skin:    rash or lesions. GI:  No-   heartburn, indigestion, abdominal pain, nausea, vomiting, diarrhea,                 change in bowel habits, loss of appetite GU: dysuria, change in color of urine, no urgency or frequency.   flank pain. MS:   joint pain, stiffness, decreased range of motion, back pain. Neuro-     nothing unusual Psych:  change in mood or affect.  depression or anxiety.   memory loss.  OBJ- Physical Exam General- Alert, Oriented, Affect-appropriate, Distress- none acute, + morbid obesity Skin- rash-none, lesions- none, excoriation- none Lymphadenopathy-  none Head- atraumatic            Eyes- Gross vision intact, PERRLA, conjunctivae and secretions clear            Ears- Hearing, canals-normal            Nose- Clear, no-Septal dev, mucus, polyps, erosion, perforation             Throat- Mallampati IV , mucosa clear , drainage- none, tonsils- atrophic, +teeth Neck- flexible , trachea midline, no stridor , thyroid nl, carotid no bruit Chest - symmetrical excursion , unlabored           Heart/CV- RRR , no murmur , no gallop  , no rub, nl s1 s2                           - JVD- none , edema- none, stasis changes- none, varices- none           Lung- clear to P&A, wheeze- none,  cough- none , dullness-none, rub- none           Chest wall-  Abd-  Br/ Gen/ Rectal- Not done, not indicated Extrem- cyanosis- none, clubbing, none, atrophy- none, strength- nl Neuro- grossly intact to observation

## 2023-01-09 ENCOUNTER — Encounter: Payer: Self-pay | Admitting: Internal Medicine

## 2023-01-09 ENCOUNTER — Ambulatory Visit: Payer: Managed Care, Other (non HMO) | Admitting: Internal Medicine

## 2023-01-11 NOTE — Progress Notes (Signed)
HPI M never smoker followed for OSA, complicated by  Morbid Obesity, Hypoxic Respiratory Failure (4/ 2020) with rib fx's attributed to cough. GERD with Cough, pulmonary hypertension, HST 07/10/2018- AHI 62.3/ hr, desaturation to 42%/ Mean sat 80%, body weight 368 lbs Pulmonary consultation with [facility] 01/01/21 with concern of Pulmonary Hypertension : Ambulated 427 meters and did not require supplemental oxygen. Pertinent Laboratory Data:  07/13/2018: BNP 16.3 06/29/2018: VBG: 7.34 76 07/12/2018: BMP: 139 / 4.6 / 101 / 33 / 12 / 1.02 / 90 06/22/2018: ANCA, ANA, Myeloperoxidase Ab, Proteinase 3, Rf all negative Pertinent PH Data: ECHO (06/18/2018): Normal LV function (EF 60-65%); no valvular/pericardial disease; TRV 322; estimated RVSP 56.5; dilated RV with reduced systolic function Echo/TEE (bubble study, 06/19/2018): severe R->L shunt detected with a PFO RHC (06/28/2018): RAP 5 mmHg; mPAP 30 mmHg; PCWP 21 mmHg; PVR 1.39 Wood units; preserved CO V/Q scan: Not performed CTA Chest (06/19/2018): no evidence of pulmonary embolizme IMPRESSION: Pulmonary hypertension (WHO Group 3, NYHA class 1)   attributed to OHS exxpected to improve on BIPAP/ weight loss. -- encouraged ongoing weight loss -- encouraged ongoing compliance with nocturnal bipa Overnight oximetry 01/12/23- on BIPAP 16/12 with O2 2L  done 10/07/21- shows desaturation </= 88% for 73 minutes with 188 ODI events. Plan- ordering DME [employer] to increase BIPAP to 18/14 and increase O2 to 3L. -------------------------------------------------------------------------  01/12/23 37 yo M former smoker  Coming to Re-establish-followed for OSA, complicated by  Morbid Obesity, Acute hypoxic Respiratory Failure (4/ 2020) with rib fx's attributed to cough. GERD with Cough -Trelegy 100,  HST 07/10/2018- AHI 62.3/ hr, desaturation to 42%/ Mean sat 80%, body weight 368 lbs BIPAP 16/ 12/ [employer]    Using AirCurve 10 VAUTO Has O2 concentrator attached to his  BIPAP, but not using O2 Body weight today 357 lbs Download compliance 87%, AHI 3.9/hr Smokes occ pot. Some dry cough occ with smoke or dry air. Discussed how to adjust CPAP humidifier for comfort.   ROS-see HPI   + = positive Constitutional:    weight loss, night sweats, fevers, chills, fatigue, lassitude. HEENT:    headaches, difficulty swallowing, tooth/dental problems, sore throat,       sneezing, itching, ear ache, nasal congestion, post nasal drip, snoring CV:    chest pain, orthopnea, PND, swelling in lower extremities, anasarca,                                   dizziness, palpitations Resp:   shortness of breath with exertion or at rest.                +productive cough,   non-productive cough, coughing up of blood.              change in color of mucus.  wheezing.   Skin:    rash or lesions. GI:  No-   heartburn, indigestion, abdominal pain, nausea, vomiting, diarrhea,                 change in bowel habits, loss of appetite GU: dysuria, change in color of urine, no urgency or frequency.   flank pain. MS:   joint pain, stiffness, decreased range of motion, back pain. Neuro-     nothing unusual Psych:  change in mood or affect.  depression or anxiety.   memory loss.  OBJ- Physical Exam General- Alert, Oriented, Affect-appropriate, Distress- none acute, + morbid obesity Skin-  rash-none, lesions- none, excoriation- none Lymphadenopathy- none Head- atraumatic            Eyes- Gross vision intact, PERRLA, conjunctivae and secretions clear            Ears- Hearing, canals-normal            Nose- Clear, no-Septal dev, mucus, polyps, erosion, perforation             Throat- Mallampati IV , mucosa clear , drainage- none, tonsils- atrophic, +teeth Neck- flexible , trachea midline, no stridor , thyroid nl, carotid no bruit Chest - symmetrical excursion , unlabored           Heart/CV- RRR , no murmur , no gallop  , no rub, nl s1 s2                           - JVD- none , edema-  none, stasis changes- none, varices- none           Lung- clear to P&A, wheeze- none, cough- none , dullness-none, rub- none           Chest wall-  Abd-  Br/ Gen/ Rectal- Not done, not indicated Extrem- cyanosis- none, clubbing, none, atrophy- none, strength- nl Neuro- grossly intact to observation

## 2023-01-12 ENCOUNTER — Encounter: Payer: Self-pay | Admitting: Internal Medicine

## 2023-01-12 ENCOUNTER — Ambulatory Visit (INDEPENDENT_AMBULATORY_CARE_PROVIDER_SITE_OTHER): Payer: Managed Care, Other (non HMO) | Admitting: Internal Medicine

## 2023-01-12 ENCOUNTER — Ambulatory Visit: Payer: Managed Care, Other (non HMO)

## 2023-01-12 VITALS — BP 152/84 | HR 77 | Ht 68.0 in | Wt 357.0 lb

## 2023-01-12 DIAGNOSIS — R053 Chronic cough: Secondary | ICD-10-CM

## 2023-01-12 DIAGNOSIS — G4733 Obstructive sleep apnea (adult) (pediatric): Secondary | ICD-10-CM | POA: Diagnosis not present

## 2023-01-12 DIAGNOSIS — J9611 Chronic respiratory failure with hypoxia: Secondary | ICD-10-CM | POA: Diagnosis not present

## 2023-01-12 NOTE — Patient Instructions (Signed)
Order- CXR    dx chronic cough  We can continue BIPAP 16/12- try to use it any time you sleep.  Keep working on your [employer]

## 2023-02-22 ENCOUNTER — Encounter: Payer: Self-pay | Admitting: Internal Medicine

## 2023-02-22 NOTE — Assessment & Plan Note (Signed)
Encourage effort at weight loss

## 2023-02-22 NOTE — Assessment & Plan Note (Signed)
Benefits from BIPAP Plan- continue 16/12, adjust humidifier for comfort

## 2023-02-22 NOTE — Assessment & Plan Note (Signed)
Not using his O2

## 2023-04-07 ENCOUNTER — Other Ambulatory Visit: Payer: Self-pay

## 2023-04-07 ENCOUNTER — Emergency Department (HOSPITAL_BASED_OUTPATIENT_CLINIC_OR_DEPARTMENT_OTHER)
Admission: EM | Admit: 2023-04-07 | Discharge: 2023-04-08 | Disposition: A | Discharge status: Home / Self Care | Attending: Emergency Medicine | Admitting: Emergency Medicine

## 2023-04-07 ENCOUNTER — Emergency Department (HOSPITAL_BASED_OUTPATIENT_CLINIC_OR_DEPARTMENT_OTHER): Admitting: Radiology

## 2023-04-07 DIAGNOSIS — I272 Pulmonary hypertension, unspecified: Secondary | ICD-10-CM | POA: Diagnosis not present

## 2023-04-07 DIAGNOSIS — R0789 Other chest pain: Secondary | ICD-10-CM | POA: Diagnosis not present

## 2023-04-07 DIAGNOSIS — Z79899 Other long term (current) drug therapy: Secondary | ICD-10-CM | POA: Insufficient documentation

## 2023-04-07 DIAGNOSIS — R053 Chronic cough: Secondary | ICD-10-CM | POA: Insufficient documentation

## 2023-04-07 MED ORDER — LIDOCAINE 5 % EX PTCH: 1.0000 | MEDICATED_PATCH | CUTANEOUS | 0 refills | Status: AC

## 2023-04-07 MED ORDER — BENZONATATE 100 MG PO CAPS: 100.0000 mg | ORAL_CAPSULE | Freq: Three times a day (TID) | ORAL | 0 refills | Status: AC

## 2023-04-07 MED ORDER — ACETAMINOPHEN-CODEINE 300-30 MG PO TABS
1.0000 | ORAL_TABLET | Freq: Four times a day (QID) | ORAL | 0 refills | Status: AC | PRN
Start: 2023-04-07 — End: ?

## 2023-04-07 MED ORDER — OXYCODONE-ACETAMINOPHEN 5-325 MG PO TABS
1.0000 | ORAL_TABLET | Freq: Once | ORAL | Status: AC
  Administered 2023-04-07: 1 via ORAL
  Filled 2023-04-07: qty 1

## 2023-04-07 MED ORDER — LIDOCAINE 5 % EX PTCH
1.0000 | MEDICATED_PATCH | CUTANEOUS | Status: DC
  Administered 2023-04-07: 1 via TRANSDERMAL
  Filled 2023-04-07: qty 1

## 2023-04-07 NOTE — Discharge Instructions (Signed)
 Follow-up with your pulmonologist regarding your cardiac hypertension chronic cough.  Your chest x-ray today showed no evidence of ruptured lung or rib fracture.  Considered and offered further diagnostic workup with labs and CT imaging however you have elected for discharge with conservative management at this time.  Please return for any severe worsening symptoms or consideration for further evaluation with CT imaging.  I have prescribed Tylenol 3 which has codeine in it, Tessalon for cough suppression as well as lidocaine patches which you can also get over-the-counter.

## 2023-04-07 NOTE — ED Provider Notes (Signed)
  EMERGENCY DEPARTMENT AT [facility] Provider Note   CSN: [id number] Arrival date & time: 04/07/23  1925     History  Chief Complaint  Patient presents with   Cough   ribcage pain    James Levine is a 38 y.o. male.   Cough    38 year old male with medical history significant for morbid obesity, pulmonary hypertension, chronic hypoxic respiratory failure with associated rib fractures attributed to cough, GERD with cough, follows outpatient with pulmonology, OSA on BiPAP 16 over 12 at night who presents to the emergency department with cough and chest discomfort.  The patient has a history of coughing so hard that he has fractured his ribs in the past.  He is on the same thing happened again yesterday.  He was coughing in the car and experienced a sharp pain on the right side.  His right rib cage is tender to palpation.  He denies any pleuritic chest discomfort.  He denies any worsening shortness of breath, fever or chills.  His cough is dry.  He denies any lower extremity swelling that is new.  He remains on BiPAP at night.,  Only wears oxygen at night with BiPAP.   Home Medications Prior to Admission medications   Medication Sig Start Date End Date Taking? Authorizing Provider  acetaminophen-codeine (TYLENOL #3) 300-30 MG tablet Take 1-2 tablets by mouth every 6 (six) hours as needed for moderate pain (pain score 4-6). 04/07/23  Yes Ernie Avena, MD  benzonatate (TESSALON) 100 MG capsule Take 1 capsule (100 mg total) by mouth every 8 (eight) hours. 04/07/23  Yes Ernie Avena, MD  lidocaine (LIDODERM) 5 % Place 1 patch onto the skin daily. Remove & Discard patch within 12 hours or as directed by MD 04/07/23  Yes Ernie Avena, MD  acetaminophen (TYLENOL) 325 MG tablet Take 2 tablets (650 mg total) by mouth every 6 (six) hours as needed for mild pain (or Fever >/= 101). Patient not taking: Reported on 01/12/2023 05/09/18   Lanae Boast, MD  cephALEXin (KEFLEX) 500 MG  capsule Take 1 capsule (500 mg total) by mouth 4 (four) times daily. Patient not taking: Reported on 01/12/2023 05/30/22   Jeanelle Malling, PA  ibuprofen (ADVIL) 600 MG tablet Take 1 tablet (600 mg total) by mouth every 6 (six) hours as needed. Patient not taking: Reported on 01/12/2023 05/30/22   Jeanelle Malling, PA  spironolactone (ALDACTONE) 25 MG tablet Take by mouth. Patient not taking: Reported on 01/12/2023 07/01/18   [provider]      Allergies    Grass extracts [gramineae pollens]    Review of Systems   Review of Systems  Respiratory:  Positive for cough.   All other systems reviewed and are negative.   Physical Exam Updated Vital Signs BP (!) 150/90 (BP Location: Right Wrist)   Pulse 94   Temp 98.6 F (37 C) (Oral)   Resp 18   Ht 5\' 9"  (1.753 m)   Wt (!) 163.3 kg   SpO2 95%   BMI 53.16 kg/m  Physical Exam Vitals and nursing note reviewed.  Constitutional:      General: He is not in acute distress.    Appearance: He is well-developed.  HENT:     Head: Normocephalic and atraumatic.  Eyes:     Conjunctiva/sclera: Conjunctivae normal.  Cardiovascular:     Rate and Rhythm: Normal rate and regular rhythm.  Pulmonary:     Effort: Pulmonary effort is normal. No respiratory distress.  Breath sounds: Normal breath sounds.  Chest:     Chest wall: Tenderness present. No crepitus.     Comments: Right lateral chest wall tenderness to palpation, no crepitus Abdominal:     Palpations: Abdomen is soft.     Tenderness: There is no abdominal tenderness.  Musculoskeletal:        General: No swelling.     Cervical back: Neck supple.  Skin:    General: Skin is warm and dry.     Capillary Refill: Capillary refill takes less than 2 seconds.  Neurological:     Mental Status: He is alert.  Psychiatric:        Mood and Affect: Mood normal.     ED Results / Procedures / Treatments   Labs (all labs ordered are listed, but only abnormal results are displayed) Labs Reviewed -  No data to display  EKG None  Radiology DG Ribs Unilateral W/Chest Right Result Date: 04/07/2023 CLINICAL DATA:  Right rib pain.  Patient reports chronic cough EXAM: RIGHT RIBS AND CHEST - 3+ VIEW COMPARISON:  Chest radiograph 01/12/2023 FINDINGS: Rib views dedicated on the lower ribs in the area of pain. No fracture or other bone lesions are seen involving the ribs. There is no pneumothorax or pleural effusion. Both lungs are clear. Heart size and mediastinal contours are within normal limits. IMPRESSION: No rib abnormality or acute pulmonary process. Electronically Signed   By: Narda Rutherford M.D.   On: 04/07/2023 22:30    Procedures Procedures    Medications Ordered in ED Medications  lidocaine (LIDODERM) 5 % 1 patch (1 patch Transdermal Patch Applied 04/07/23 2354)  oxyCODONE-acetaminophen (PERCOCET/ROXICET) 5-325 MG per tablet 1 tablet (1 tablet Oral Given 04/07/23 2355)    ED Course/ Medical Decision Making/ A&P                                 Medical Decision Making Amount and/or Complexity of Data Reviewed Radiology: ordered.  Risk Prescription drug management.     38 year old male with medical history significant for morbid obesity, pulmonary hypertension, chronic hypoxic respiratory failure with associated rib fractures attributed to cough, GERD with cough, follows outpatient with pulmonology, OSA on BiPAP 16 over 12 at night who presents to the emergency department with cough and chest discomfort.  The patient has a history of coughing so hard that he has fractured his ribs in the past.  He is on the same thing happened again yesterday.  He was coughing in the car and experienced a sharp pain on the right side.  His right rib cage is tender to palpation.  He denies any pleuritic chest discomfort.  He denies any worsening shortness of breath, fever or chills.  His cough is dry.  He denies any lower extremity swelling that is new.  He remains on BiPAP at night.,  Only wears  oxygen at night with BiPAP.   On arrival, the patient was afebrile, not tachycardic, saturating 95% on room air, BP 150/90.  Patient with reducible Right-sided chest wall tenderness to palpation.  Considered rib fracture, pneumothorax, less likely PE, pneumonia, CHF.  Considered pleuritis and musculoskeletal chest pain.  Patient initial chest x-ray Included ribs on the right was performed with no evidence of rib fracture, no evidence of pneumothorax or pneumonia...  Take patient chest discomfort came on he is set up to 3 coughing.  Suspect likely musculoskeletal chest wall pain versus diaphragmatic irritation  this evening, will treat his chronic cough.  Low suspicion for PE, pt is PERC negative. Low concern for other acute cardiac or pulmonary abnormality.  Patient was administered Percocet as well as a lidocaine patch.  Discussed with him further diagnostic workup to include laboratory evaluation urine and CT imaging versus watchful waiting, pain control at home close follow-up.  Patient elected for pain control at home, will return for any worsening symptoms for further workup.  Final Clinical Impression(s) / ED Diagnoses Final diagnoses:  Chronic cough  Musculoskeletal chest pain    Rx / DC Orders ED Discharge Orders          Ordered    acetaminophen-codeine (TYLENOL #3) 300-30 MG tablet  Every 6 hours PRN        04/07/23 2350    benzonatate (TESSALON) 100 MG capsule  Every 8 hours        04/07/23 2350    lidocaine (LIDODERM) 5 %  Every 24 hours        04/07/23 2351              Ernie Avena, MD 04/07/23 2356

## 2023-04-07 NOTE — ED Triage Notes (Signed)
 Pt POV from home reporting R rib pain when coughing or sneezing. Chronic cough, hx of similar pain every few months.

## 2023-04-08 NOTE — ED Notes (Signed)
 RN reviewed discharge instructions with pt. Pt verbalized understanding and had no further questions. VSS upon discharge.

## 2023-07-31 ENCOUNTER — Emergency Department (HOSPITAL_BASED_OUTPATIENT_CLINIC_OR_DEPARTMENT_OTHER)
Admission: EM | Admit: 2023-07-31 | Discharge: 2023-07-31 | Disposition: A | Discharge status: Home / Self Care | Attending: Emergency Medicine | Admitting: Emergency Medicine

## 2023-07-31 ENCOUNTER — Other Ambulatory Visit: Payer: Self-pay

## 2023-07-31 ENCOUNTER — Encounter (HOSPITAL_BASED_OUTPATIENT_CLINIC_OR_DEPARTMENT_OTHER): Payer: Self-pay | Admitting: Emergency Medicine

## 2023-07-31 ENCOUNTER — Emergency Department (HOSPITAL_BASED_OUTPATIENT_CLINIC_OR_DEPARTMENT_OTHER): Admitting: Radiology

## 2023-07-31 DIAGNOSIS — L03116 Cellulitis of left lower limb: Secondary | ICD-10-CM | POA: Diagnosis not present

## 2023-07-31 DIAGNOSIS — M25572 Pain in left ankle and joints of left foot: Secondary | ICD-10-CM

## 2023-07-31 DIAGNOSIS — M79672 Pain in left foot: Secondary | ICD-10-CM | POA: Diagnosis present

## 2023-07-31 MED ORDER — ACETAMINOPHEN 500 MG PO TABS
1000.0000 mg | ORAL_TABLET | Freq: Once | ORAL | Status: AC
  Administered 2023-07-31: 1000 mg via ORAL
  Filled 2023-07-31: qty 2

## 2023-07-31 MED ORDER — CEPHALEXIN 500 MG PO CAPS: 500.0000 mg | ORAL_CAPSULE | Freq: Four times a day (QID) | ORAL | 0 refills | Status: AC

## 2023-07-31 MED ORDER — OXYCODONE HCL 5 MG PO TABS: 5.0000 mg | ORAL_TABLET | ORAL | 0 refills | Status: AC | PRN

## 2023-07-31 NOTE — ED Triage Notes (Signed)
 Pt via pov from home with left foot pain x 4 days. Pt denies any known injury. He has had similar pain in the past. &O x 4; nad noted.

## 2023-07-31 NOTE — Discharge Instructions (Signed)
 You were seen in the ER today for concerns of left foot/ankle pain. Based on your exam and xray, I suspect you may have early signs of cellulitis developing in this ankle. I am starting you on antibiotics to attempt to manage your symptoms. For any concerns of new or worsening symptoms, return to the ER. Practice leg elevation and using compressive dressing to help manage the swelling.

## 2023-07-31 NOTE — ED Provider Notes (Signed)
 [place] EMERGENCY DEPARTMENT AT [facility] Provider Note   CSN: [id number] Arrival date & time: 07/31/23  1759     Patient presents with: Foot Pain   James Levine is a 38 y.o. male.  Patient with past history significant for morbid obesity, sleep apnea presents the emergency department with concerns of foot pain.  Reports ongoing left-sided foot pain for the last 4 days with swelling to the lower extremity.  Denies any recent injuries but does report chronic swelling in this area from prior surgery following a complex fracture.  He states he has felt some warmth to the area of the ankle/foot on the left side denies any lesions or areas of drainage.  Reports prior history of similar symptoms several years ago which was due to cellulitis.  Has not followed up with his orthopedic team recently.    Foot Pain       Prior to Admission medications   Medication Sig Start Date End Date Taking? Authorizing Provider  cephALEXin  (KEFLEX ) 500 MG capsule Take 1 capsule (500 mg total) by mouth 4 (four) times daily for 5 days. 07/31/23 08/05/23 Yes Yoana Staib A, PA-C  oxyCODONE  (ROXICODONE ) 5 MG immediate release tablet Take 1 tablet (5 mg total) by mouth every 4 (four) hours as needed for severe pain (pain score 7-10). 07/31/23  Yes Takita Riecke A, PA-C  acetaminophen  (TYLENOL ) 325 MG tablet Take 2 tablets (650 mg total) by mouth every 6 (six) hours as needed for mild pain (or Fever >/= 101). Patient not taking: Reported on 01/12/2023 05/09/18   Christobal Guadalajara, MD  acetaminophen -codeine  (TYLENOL  #3) 300-30 MG tablet Take 1-2 tablets by mouth every 6 (six) hours as needed for moderate pain (pain score 4-6). 04/07/23   Jerrol Agent, MD  benzonatate  (TESSALON ) 100 MG capsule Take 1 capsule (100 mg total) by mouth every 8 (eight) hours. 04/07/23   Jerrol Agent, MD  ibuprofen  (ADVIL ) 600 MG tablet Take 1 tablet (600 mg total) by mouth every 6 (six) hours as needed. Patient not taking: Reported on  01/12/2023 05/30/22   Ladora Congress, PA  lidocaine  (LIDODERM ) 5 % Place 1 patch onto the skin daily. Remove & Discard patch within 12 hours or as directed by MD 04/07/23   Jerrol Agent, MD  spironolactone (ALDACTONE) 25 MG tablet Take by mouth. Patient not taking: Reported on 01/12/2023 07/01/18   [provider]    Allergies: Grass extracts [gramineae pollens]    Review of Systems  Musculoskeletal:  Positive for joint swelling.  All other systems reviewed and are negative.   Updated Vital Signs BP (!) 161/104 (BP Location: Right Wrist)   Pulse (!) 102   Temp 100 F (37.8 C) (Oral)   Resp (!) 22   Ht 5' 9 (1.753 m)   Wt (!) 147.4 kg   SpO2 98%   BMI 47.99 kg/m   Physical Exam Vitals and nursing note reviewed.  Constitutional:      General: He is not in acute distress.    Appearance: He is well-developed.  HENT:     Head: Normocephalic and atraumatic.   Eyes:     Conjunctiva/sclera: Conjunctivae normal.    Cardiovascular:     Rate and Rhythm: Normal rate and regular rhythm.     Heart sounds: No murmur heard. Pulmonary:     Effort: Pulmonary effort is normal. No respiratory distress.     Breath sounds: Normal breath sounds.  Abdominal:     Palpations: Abdomen is soft.  Tenderness: There is no abdominal tenderness.   Musculoskeletal:        General: Swelling and tenderness present. No deformity or signs of injury. Normal range of motion.     Cervical back: Neck supple.       Legs:     Comments: Left leg distal to ankle with slight erythema and swelling present, minimal skin induration   Skin:    General: Skin is warm and dry.     Capillary Refill: Capillary refill takes less than 2 seconds.   Neurological:     Mental Status: He is alert.   Psychiatric:        Mood and Affect: Mood normal.     (all labs ordered are listed, but only abnormal results are displayed) Labs Reviewed - No data to display  EKG: None  Radiology: DG Foot Complete  Left Result Date: 07/31/2023 CLINICAL DATA:  Left foot pain for 4 days.  No known injury. EXAM: LEFT FOOT - COMPLETE 3+ VIEW COMPARISON:  None Available. FINDINGS: The bones appear adequately mineralized. No evidence of acute fracture or dislocation. Mild degenerative changes at the 1st metatarsophalangeal joint with a mild hallux valgus deformity. Possible posttraumatic deformity of the head of the 5th proximal phalanx. There are postsurgical changes within the distal tibia with 2 cannulated screws. Underlying moderate tibiotalar degenerative changes are present with a possible ankle joint effusion. Diffuse prominence of the soft tissues within the lower leg and hindfoot. IMPRESSION: 1. No acute osseous findings. 2. Previous distal tibial ORIF with moderate tibiotalar degenerative changes with possible ankle joint effusion. 3. Mild degenerative changes at the 1st metatarsophalangeal joint. Electronically Signed   By: Elsie Perone M.D.   On: 07/31/2023 19:06     Procedures   Medications Ordered in the ED - No data to display  Clinical Course as of 07/31/23 2054  Mon Jul 31, 2023  2050 DG Foot Complete Left [OZ]    Clinical Course User Index [OZ] Cecily Legrand LABOR, PA-C                                 Medical Decision Making Amount and/or Complexity of Data Reviewed Radiology: ordered.   This patient presents to the ED for concern of foot pain, ankle pain.  Differential diagnosis includes cellulitis, ankle fracture, ankle dislocation, joint effusion, septic arthritis   Imaging Studies ordered:  I ordered imaging studies including xray left foot I independently visualized and interpreted imaging which showed No acute osseous findings. 2. Previous distal tibial ORIF with moderate tibiotalar degenerative changes with possible ankle joint effusion. 3. Mild degenerative changes at the 1st metatarsophalangeal joint I agree with the radiologist interpretation   Problem List / ED  Course:  Patient with past history significant for morbid obesity, sleep apnea presents ED with concerns of foot pain.  Reports prior surgery about 20 years ago from a fracture to the left ankle.  He endorses worsening pain over the last 4 days with swelling and some erythema to the ankle.  Denies any fevers, chills or bodyaches.  No reported lesions or lacerations or any sign of drainage from the ankle.  He states his ankle is chronically swollen but is slightly worsened over the last few days.  Denies any recent injury. Exam reveals swollen left ankle with palpable warmth overlying the skin that shows mild erythema.  There is no induration of the skin and patient is able to  move the ankle without significant difficulty.  There is some tenderness along the anteriormedial aspect of the left ankle. X-ray imaging obtained which shows no significant abnormalities.  Possible ankle joint effusion is seen here but again, chronic in nature.  Suspect likely cellulitis given the erythema and warmth to the ankle.  At bedside, reassessed patient's temperature shows he is currently 98.6.  Doubt septic arthritis.  Will start patient on a course of Keflex  for suspected cellulitis.  He reports he is unable to take NSAIDs due to recommendations from his PCP.  Has tried Tylenol  with minimal improvement.  Encouraged continued use of leg elevation and ice to help manage swelling. Will discharge home with prescription for oxycodone  for severe pain given inability to tolerate NSAIDs. Stable at this time for outpatient follow and discharged home.  Final diagnoses:  Cellulitis of left lower extremity  Acute left ankle pain    ED Discharge Orders          Ordered    cephALEXin  (KEFLEX ) 500 MG capsule  4 times daily        07/31/23 2048    oxyCODONE  (ROXICODONE ) 5 MG immediate release tablet  Every 4 hours PRN        07/31/23 2048               Renie Stelmach A, PA-C 07/31/23 2054    Towana Ozell BROCKS,  MD 08/01/23 1013

## 2024-01-15 ENCOUNTER — Ambulatory Visit: Payer: Managed Care, Other (non HMO) | Admitting: Internal Medicine
